# Patient Record
Sex: Female | Born: 1960
Health system: Southern US, Community
[De-identification: ages and names within clinical notes are randomized; demographics above are authoritative.]

## PROBLEM LIST (undated history)

## (undated) DIAGNOSIS — Z5189 Encounter for other specified aftercare: Secondary | ICD-10-CM

## (undated) DIAGNOSIS — D649 Anemia, unspecified: Secondary | ICD-10-CM

## (undated) DIAGNOSIS — F419 Anxiety disorder, unspecified: Secondary | ICD-10-CM

## (undated) DIAGNOSIS — R7303 Prediabetes: Secondary | ICD-10-CM

## (undated) DIAGNOSIS — I517 Cardiomegaly: Secondary | ICD-10-CM

## (undated) DIAGNOSIS — R0602 Shortness of breath: Secondary | ICD-10-CM

## (undated) DIAGNOSIS — IMO0001 Reserved for inherently not codable concepts without codable children: Secondary | ICD-10-CM

## (undated) DIAGNOSIS — R51 Headache: Secondary | ICD-10-CM

## (undated) DIAGNOSIS — C8590 Non-Hodgkin lymphoma, unspecified, unspecified site: Secondary | ICD-10-CM

## (undated) DIAGNOSIS — I10 Essential (primary) hypertension: Secondary | ICD-10-CM

## (undated) DIAGNOSIS — K219 Gastro-esophageal reflux disease without esophagitis: Secondary | ICD-10-CM

## (undated) DIAGNOSIS — T7840XA Allergy, unspecified, initial encounter: Secondary | ICD-10-CM

## (undated) HISTORY — PX: WISDOM TOOTH EXTRACTION: SHX21

## (undated) HISTORY — PX: COLONOSCOPY: SHX174

## (undated) HISTORY — DX: Non-Hodgkin lymphoma, unspecified, unspecified site: C85.90

## (undated) HISTORY — DX: Allergy, unspecified, initial encounter: T78.40XA

## (undated) HISTORY — DX: Prediabetes: R73.03

## (undated) HISTORY — DX: Anxiety disorder, unspecified: F41.9

## (undated) HISTORY — PX: MYOMECTOMY: SHX85

## (undated) HISTORY — PX: APPENDECTOMY: SHX54

---

## 1999-03-02 ENCOUNTER — Inpatient Hospital Stay (HOSPITAL_COMMUNITY): Admission: AD | Admit: 1999-03-02 | Discharge: 1999-03-07 | Payer: Self-pay | Admitting: Obstetrics and Gynecology

## 2000-09-09 ENCOUNTER — Encounter: Admission: RE | Admit: 2000-09-09 | Discharge: 2000-09-09 | Payer: Self-pay | Admitting: Obstetrics and Gynecology

## 2000-09-09 ENCOUNTER — Encounter: Payer: Self-pay | Admitting: Obstetrics and Gynecology

## 2001-10-16 ENCOUNTER — Encounter: Admission: RE | Admit: 2001-10-16 | Discharge: 2001-10-16 | Payer: Self-pay | Admitting: Obstetrics and Gynecology

## 2001-10-16 ENCOUNTER — Encounter: Payer: Self-pay | Admitting: Obstetrics and Gynecology

## 2001-12-28 ENCOUNTER — Encounter: Admission: RE | Admit: 2001-12-28 | Discharge: 2002-01-14 | Payer: Self-pay | Admitting: Orthopaedic Surgery

## 2003-11-30 ENCOUNTER — Other Ambulatory Visit: Admission: RE | Admit: 2003-11-30 | Discharge: 2003-11-30 | Payer: Self-pay | Admitting: Obstetrics & Gynecology

## 2005-06-14 ENCOUNTER — Other Ambulatory Visit: Admission: RE | Admit: 2005-06-14 | Discharge: 2005-06-14 | Payer: Self-pay | Admitting: Obstetrics & Gynecology

## 2007-07-06 ENCOUNTER — Emergency Department (HOSPITAL_COMMUNITY): Admission: EM | Admit: 2007-07-06 | Discharge: 2007-07-06 | Payer: Self-pay | Admitting: Emergency Medicine

## 2007-09-17 ENCOUNTER — Emergency Department (HOSPITAL_COMMUNITY): Admission: EM | Admit: 2007-09-17 | Discharge: 2007-09-17 | Payer: Self-pay | Admitting: Emergency Medicine

## 2008-10-27 IMAGING — CT CT HEAD W/O CM
1 series · 16 of 28 positions shown, 20 images · IV contrast (agent unspecified)
Comparison: None.

CLINICAL DATA: Hypertension/headache.
 HEAD CT WITHOUT CONTRAST:
TECHNIQUE: Contiguous axial images were obtained from the base of the skull through the vertex according to standard protocol without contrast.

[Series 2: brain · axial · 0.47mm/px · z∈[+158,+288]mm · 16 of 28 slices shown, 20 images]
[im 2/28  brain]
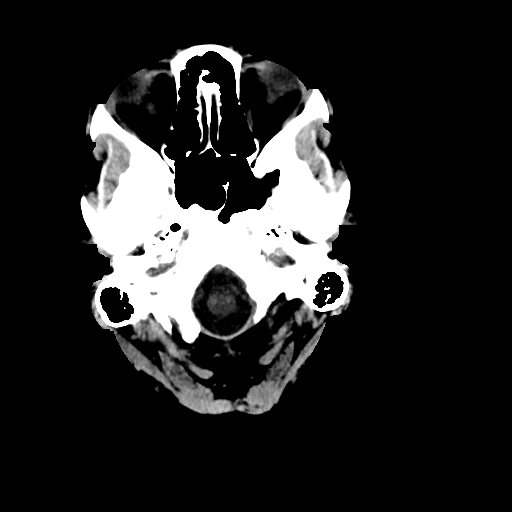
[im 2/28  bone]
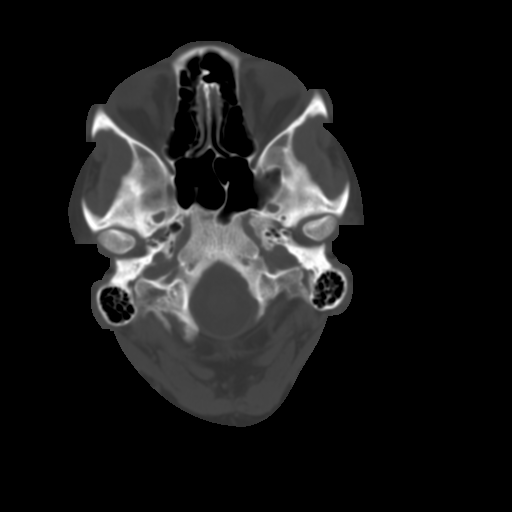
[im 4/28  brain]
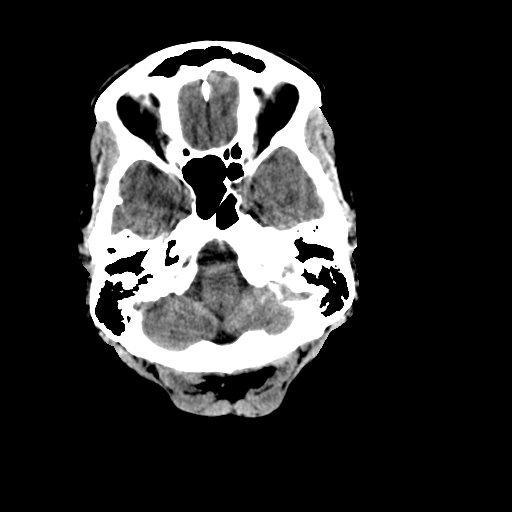
[im 6/28  brain]
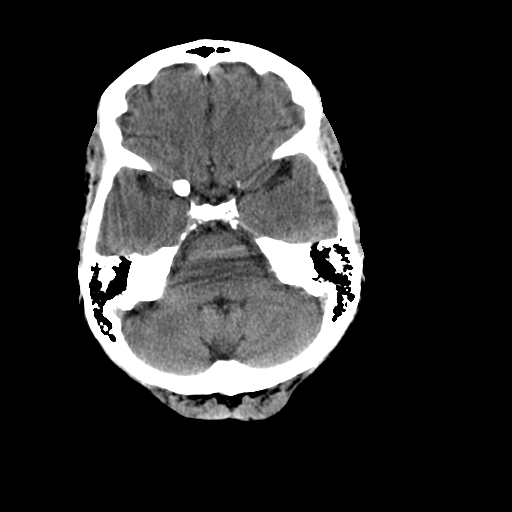
[im 7/28  brain]
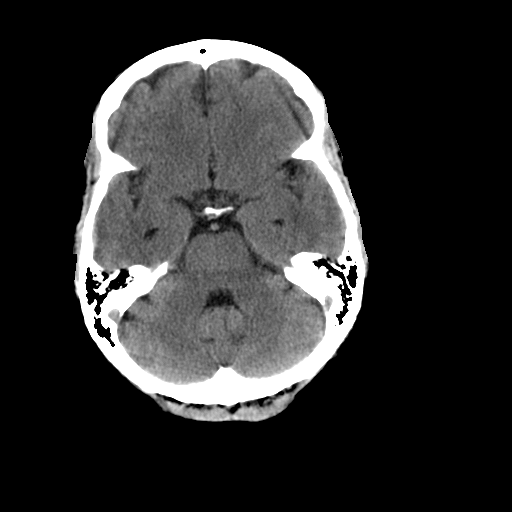
[im 9/28  brain]
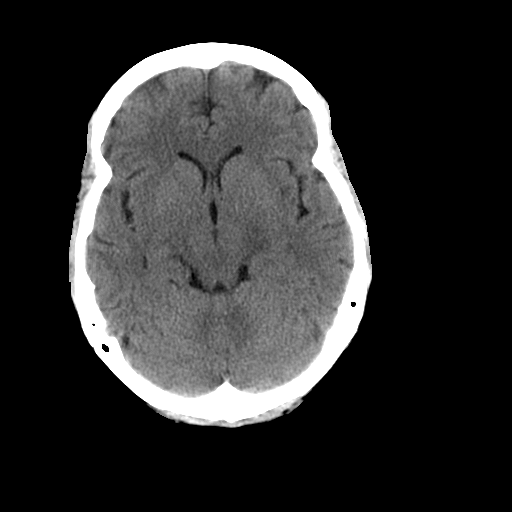
[im 9/28  bone]
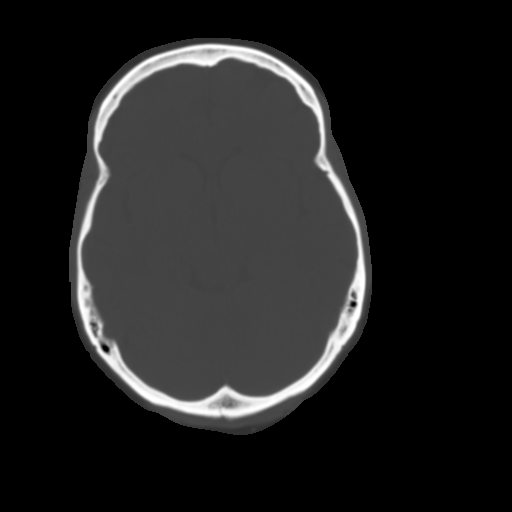
[im 10/28  brain]
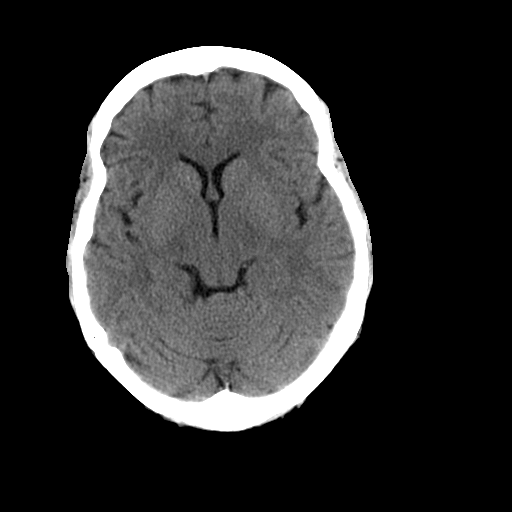
[im 12/28  brain]
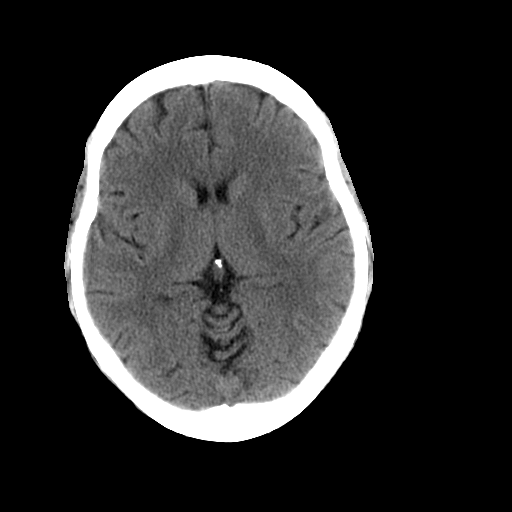
[im 14/28  brain]
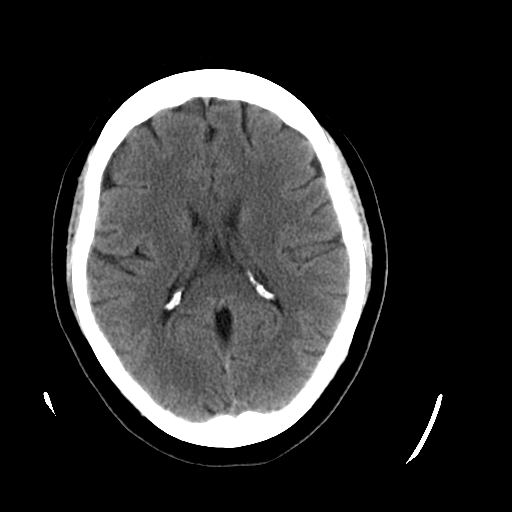
[im 15/28  brain]
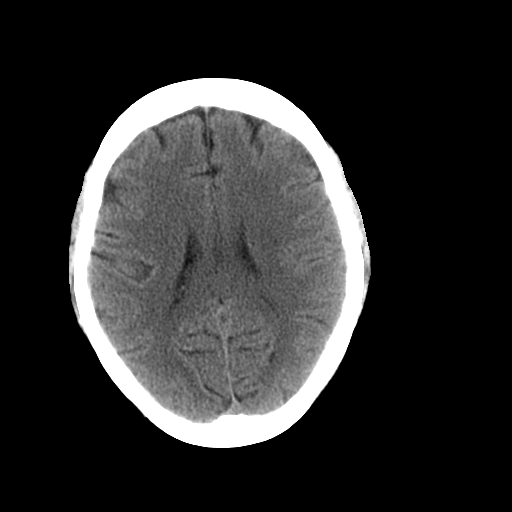
[im 15/28  bone]
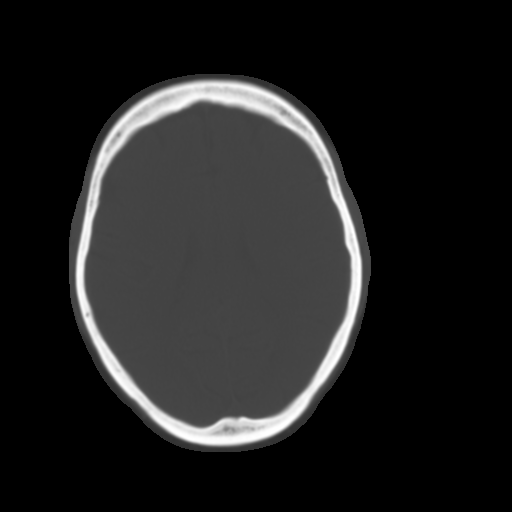
[im 17/28  brain]
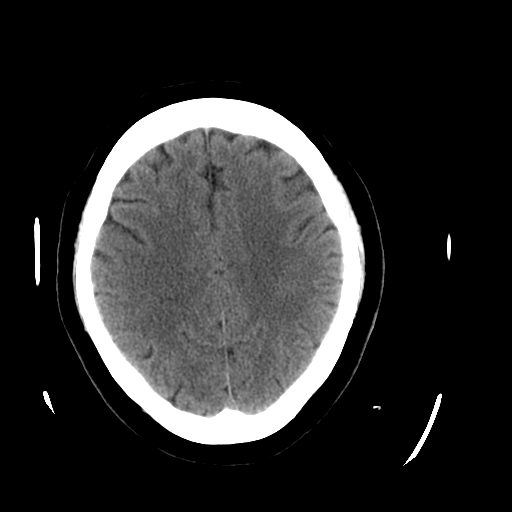
[im 19/28  brain]
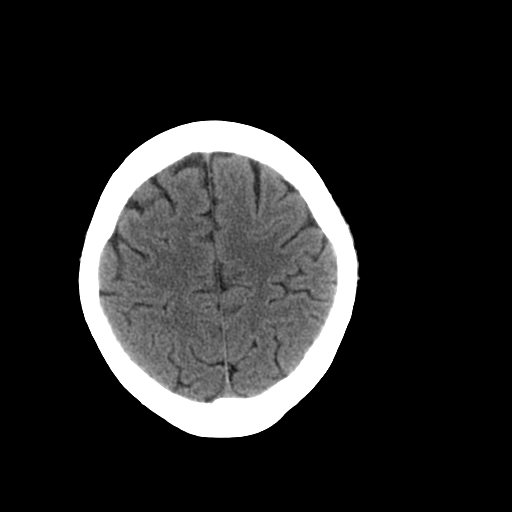
[im 20/28  brain]
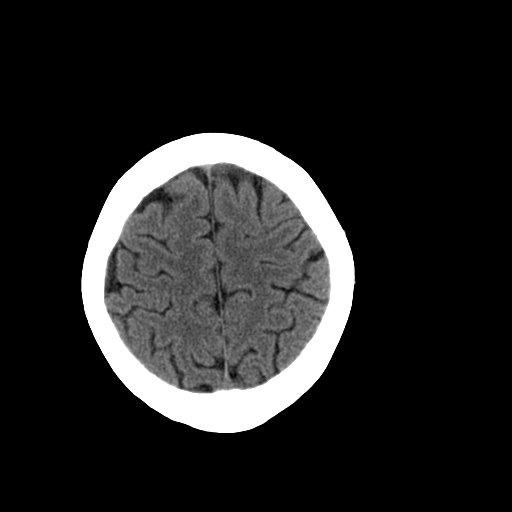
[im 22/28  brain]
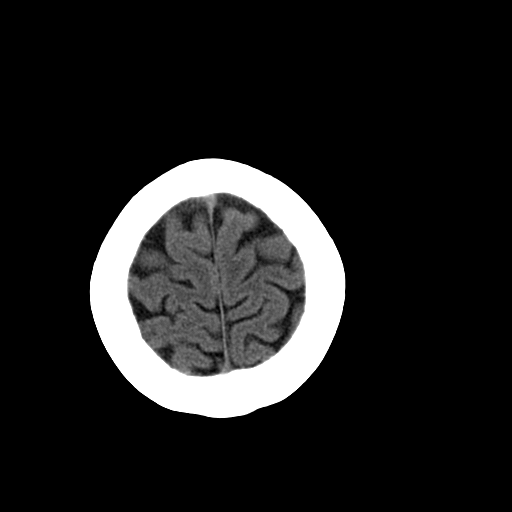
[im 22/28  bone]
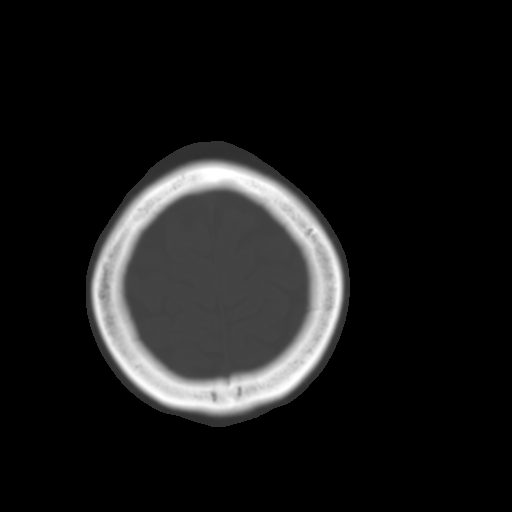
[im 23/28  brain]
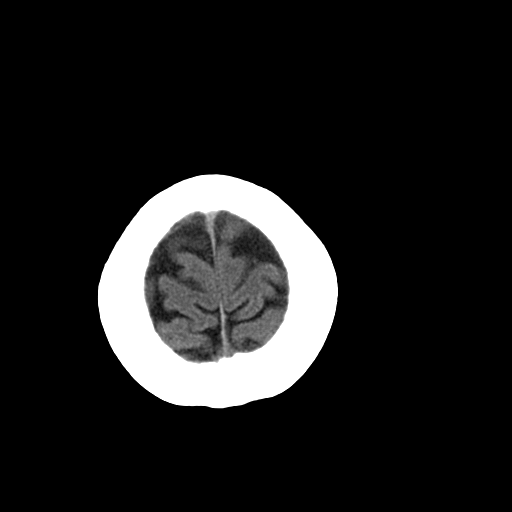
[im 25/28  brain]
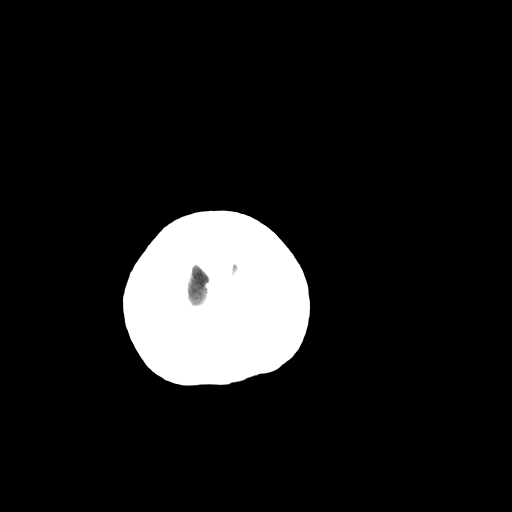
[im 27/28  brain]
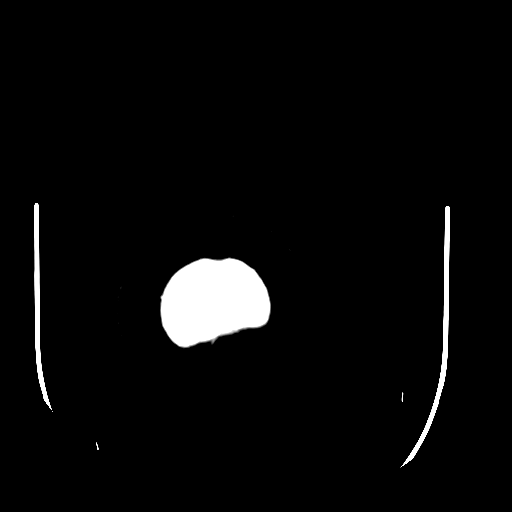

[16 of 28 positions shown; findings below may reference images not displayed]

FINDINGS: There is no evidence of intracranial hemorrhage, brain edema, acute infarct, mass lesion, or mass effect.  No other intra-axial abnormalities are seen, and the ventricles are within normal limits.  No abnormal extra-axial fluid collections or masses are identified.  No skull abnormalities are noted.
IMPRESSION: Negative non-contrast head CT.

## 2008-11-10 ENCOUNTER — Other Ambulatory Visit: Admission: RE | Admit: 2008-11-10 | Discharge: 2008-11-10 | Payer: Self-pay | Admitting: Family Medicine

## 2010-11-19 ENCOUNTER — Ambulatory Visit: Payer: Self-pay | Admitting: Family Medicine

## 2010-11-29 ENCOUNTER — Ambulatory Visit: Payer: Self-pay | Admitting: Family Medicine

## 2011-04-08 ENCOUNTER — Encounter (HOSPITAL_COMMUNITY): Payer: Self-pay

## 2011-04-08 ENCOUNTER — Encounter (HOSPITAL_COMMUNITY)
Admission: RE | Admit: 2011-04-08 | Discharge: 2011-04-08 | Disposition: A | Discharge status: Home / Self Care | Payer: 59 | Source: Ambulatory Visit | Attending: Obstetrics and Gynecology | Admitting: Obstetrics and Gynecology

## 2011-04-08 HISTORY — DX: Essential (primary) hypertension: I10

## 2011-04-08 HISTORY — DX: Reserved for inherently not codable concepts without codable children: IMO0001

## 2011-04-08 HISTORY — DX: Cardiomegaly: I51.7

## 2011-04-08 HISTORY — DX: Headache: R51

## 2011-04-08 HISTORY — DX: Shortness of breath: R06.02

## 2011-04-08 HISTORY — DX: Gastro-esophageal reflux disease without esophagitis: K21.9

## 2011-04-08 HISTORY — DX: Anemia, unspecified: D64.9

## 2011-04-08 HISTORY — DX: Encounter for other specified aftercare: Z51.89

## 2011-04-08 LAB — BASIC METABOLIC PANEL
CO2: 28 mEq/L (ref 19–32)
Calcium: 10.6 mg/dL — ABNORMAL HIGH (ref 8.4–10.5)
Glucose, Bld: 91 mg/dL (ref 70–99)
Potassium: 4.1 mEq/L (ref 3.5–5.1)
Sodium: 138 mEq/L (ref 135–145)

## 2011-04-08 LAB — CBC
Hemoglobin: 11.5 g/dL — ABNORMAL LOW (ref 12.0–15.0)
MCH: 23.4 pg — ABNORMAL LOW (ref 26.0–34.0)
RBC: 4.92 MIL/uL (ref 3.87–5.11)

## 2011-04-08 LAB — SURGICAL PCR SCREEN: Staphylococcus aureus: NEGATIVE

## 2011-04-08 NOTE — Anesthesia Preprocedure Evaluation (Deleted)
Anesthesia Evaluation     <MCANESPLAN>  @ANQUICKEVALUATION@  

## 2011-04-08 NOTE — Patient Instructions (Addendum)
20 [name]  04/08/2011   Your procedure is scheduled on:  April 25, 2011  Report to [facility] at 6 AM.  Call this number if you have problems the morning of surgery: [phone]   Remember:   Do not eat food:After Midnight.Wednesday  Do not drink clear liquids: After Midnight Wednesday.  Take these medicines the morning of surgery with A SIP OF WATER: BP med   Do not wear jewelry, make-up or nail polish.  Do not bring valuables to the hospital.  Contacts, dentures or bridgework may not be worn into surgery.  Leave suitcase in the car. After surgery it may be brought to your room.  For patients admitted to the hospital, checkout time is 11:00 AM the day of discharge.   Patients discharged the day of surgery will not be allowed to drive home.  Name and phone number of your driver:   Special Instructions: CHG Shower Use entire pack 2 days before surgery and repeat 1 day before surgery.   Please read over the following fact sheets that you were given: Pain Booklet and MRSA Information

## 2011-04-08 NOTE — Pre-Procedure Instructions (Addendum)
Patient was seen by Dr Malen Gauze at end of pre-op appt.  Dr Malen Gauze informed patient to take reflux med 3 days prior to surgery daily and take bp med Day of surgery with sip of water.  Patient to stop calcium citrate DOS per Dr Malen Gauze.  Patient verbalized understanding.  Dr Malen Gauze asked patient to get MG med refilled and bring it with her day of surgery.

## 2011-04-08 NOTE — Anesthesia Preprocedure Evaluation (Deleted)
Anesthesia Evaluation     <MCANESPLAN>  @ANQUICKEVALUATION @

## 2011-04-14 HISTORY — PX: OTHER SURGICAL HISTORY: SHX169

## 2011-04-22 ENCOUNTER — Other Ambulatory Visit (HOSPITAL_COMMUNITY): Payer: 59

## 2011-04-24 ENCOUNTER — Encounter (HOSPITAL_COMMUNITY): Payer: Self-pay | Admitting: Anesthesiology

## 2011-04-24 MED ORDER — DEXTROSE 5 % IV SOLN
2.0000 g | INTRAVENOUS | Status: AC
  Administered 2011-04-25: 2 g via INTRAVENOUS
  Filled 2011-04-24: qty 2

## 2011-04-24 MED ORDER — DEXTROSE 5 % IV SOLN
2.0000 g | Freq: Once | INTRAVENOUS | Status: DC
  Filled 2011-04-24: qty 2

## 2011-04-24 NOTE — H&P (Signed)
[name], [name]                    ACCOUNT NO.:  [id number]  MEDICAL RECORD NO.:  [id number]  LOCATION:  [facility]                           FACILITY:  [facility]  PHYSICIAN:  Dois Davenport A. Rivard, M.D. DATE OF BIRTH:  1960/11/05  DATE OF ADMISSION:  04/08/2011 DATE OF DISCHARGE:  04/08/2011                             HISTORY & PHYSICAL   HISTORY OF PRESENT ILLNESS:  [name] is a 50 year old single black female para 1-0-2-1 presenting for robotically-assisted supracervical hysterectomy because of symptomatic uterine fibroids.  This patient is status post myomectomy (1995) who for the past several years has had vaginal bleeding that may last up to 2 weeks and require her to change a pad twice an hour or every 2 hours.  Additionally, the patient has  large clots and menstrual cramps that she rates as a 10/10 on a 10-point pain scale.  The patient is able to achieve some relief from her cramping by taking ibuprofen 800 mg decreasing her pain to 5/10 on a 10- point pain scale.  In February 2012, her hemoglobin/hematocrit was 9.8/33.3 respectively and she had an endometrial biopsy that did not show any hyperplasia, atypia, or malignancy.  The patient was given in May 2012 Lupron Depot 11.25 mg IM and since that time she has been amenorrheic.  She goes on to say, however, that she does experience some mild pelvic discomfort around the time she normally would have a menstrual period.  The patient denies any difficulty urinating, any changes in her bowel habits, vaginitis symptoms, dyspareunia, or back pain.  A pelvic ultrasound done prior to receiving Lupron Depot showed a uterus measuring 18.0 x 13.1 x 8.93 cm and a posterior fibroid measuring 14.6 x 12.3 x 9.86 cm.  Both of the patient's ovaries appeared normal on that study.  In May 2012, after having been on Lupron for 2 months, her uterus decreased to 15.7 cm x 11.5 cm x 9.30 cm and her posterior fibroid decreased to 10.4 x 10.8 x 9.47 cm.  Again, the  patient's ovaries appeared normal on this study.  Given the protracted nature of the patient's vaginal bleeding and the size of her uterine fibroid, she has consented to proceed with definitive therapy in the form of a robotically- assisted supra-cervical hysterectomy.  PAST MEDICAL HISTORY:  OB History:  Gravida 3, para 1-0-2-1.  The patient had cesarean section in 2000.  GYN History:  Menarche 50 years old.  Last menstrual period March 2012.  She uses abstinence as her method of contraception.  She denies any history of sexually transmitted diseases or abnormal Pap smears.  Her last normal Pap smear was February 2012.    Past Medical History:  Hypertension, anemia, blood transfusion due to menorrhagia, myasthenia gravis, hypercalcemia, herpes simplex virus #2, H. pylori, and vitamin D deficiency.  SURGICAL HISTORY:  1975 appendectomy, 1995 myomectomy.  Denies any problems with anesthesia.  FAMILY HISTORY:  Thyroid disease, lung cancer, cardiovascular disease, diabetes mellitus, hypertension, and migraines.  SOCIAL HISTORY:  The patient is single and she works in [employer].    Habits:  She does not use tobacco or illicit drugs.  She rarely  consumes alcohol.  CURRENT MEDICATIONS: 1. Lisinopril/HCTZ 20/25 mg daily 2. Vitamin D 50,000 units twice weekly. 3. Omeprazole 40 mg daily as needed.  ALLERGIES:  The patient does have a sensitivity to CODEINE that causes GI upset.  She denies any sensitivities to peanuts, soy, or shellfish, but is uncertain as to whether she has any latex sensitivity   [the patient is to began on April 23, 2011, Mestinon 180 mg extended release Daily for her Myasthenia Gravis].  REVIEW OF SYSTEMS:  The patient does wear glasses.  She admits to arthralgias and eyelid weakness.  She denies any chest pain, shortness of breath, headache, vision changes, myalgias, dysphagia, nasal congestion, nausea, vomiting, diarrhea, urinary tract  symptoms, unilateral weakness, paresthesias, or extremity muscle weakness.  PHYSICAL EXAM:  VITAL SIGNS:  Blood pressure 140/80, pulse is 78, respirations 16, temperature 98.0 degrees Fahrenheit orally, weight 203 pounds, height 5 feet 6 inches tall, body mass index 32.8.  NECK:  Supple without masses.  There is no thyromegaly or cervical adenopathy. HEART:  Regular rate and rhythm. LUNGS:  Clear. BACK:  No CVA tenderness. ABDOMEN:  Tender in the right lower quadrant, but no rebound, though there is some voluntary guarding.  There is a firm mass arising from the pelvis to approximately three fingerbreadths below the umbilicus.  There is no organomegaly. EXTREMITIES:  No clubbing, cyanosis or edema. PELVIC:  EGBUS is normal.  Vagina is normal.  Cervix is nontender without lesions.  Uterus appears 20-22 weeks' size with diffuse tenderness.  Adnexa without any separable masses.  IMPRESSION:  Symptomatic uterine fibroids.  DISPOSITION:  A discussion was held with the patient regarding indications for her procedures along with their risks which include, but are not limited to reaction to anesthesia, damage to adjacent organs, infection, excessive bleeding, and a possible need for an open abdominal incision.  The patient further understands that hospital stay is expected to be 0-2 days and that she is expected to return to her activities within 2-3 weeks.  The patient also was informed that the robotic approach requires more time than that of an open abdominal incision and she will receive and complete a MiraLax bowel prep 24 hours prior to her procedure.  The patient has consented to proceed with the robotically-assisted supracervical hysterectomy at [facility] on April 25, 2011, at 7:30 a.m.     Rahiem Schellinger J. Lowell Guitar, P.A.-C   ______________________________ Crist Fat Rivard, M.D.    EJP/MEDQ  D:  04/23/2011  T:  04/24/2011  Job:  [id number]

## 2011-04-24 NOTE — H&P (Unsigned)
NAMEMADINAH, QUARRY                    ACCOUNT NO.:  [id number]  MEDICAL RECORD NO.:  [id number]  LOCATION:  [facility]                           FACILITY:  [facility]  PHYSICIAN:  Dois Davenport A. Rivard, M.D. DATE OF BIRTH:  10-Dec-1960  DATE OF ADMISSION:  04/08/2011 DATE OF DISCHARGE:  04/08/2011                             HISTORY & PHYSICAL   HISTORY OF PRESENT ILLNESS:  Ms. Barb is a 50 year old single black female, para 1-0-2-1, presenting for robotic-assisted supracervical hysterectomy because of symptomatic uterine fibroids.  The patient is status post myomectomy (1995) who complains of heavy vaginal bleeding for the past several years.  The patient will flow for up to 2 weeks requiring her to change the pad on occasion twice an hour to every 2 hours.  Additionally, she will have large clotting and experience cramping, which she rates the 10/10 on a 10-point pain scale.  The patient does find some relief with ibuprofen 800 mg decreasing her pain to 5/10 on a 10-point pain scale.  She goes on to say she has had some intermenstrual bleeding, but denies any problems with urination changes in her bowel habits, dyspareunia, vaginitis symptoms, or back pain.  The patient underwent an endometrial biopsy in February 2012, which did not reveal any hyperplasia, atypia, or malignancy.  A pelvic ultrasound at that same time showed a uterus measuring 18.0 cm x 13.1 cm x 8.93 cm with a posterior fibroid measuring 14.6 x 12.3 x 9.86 cm with both ovaries appeared normal on that study.  The patient was placed on Lupron Depot 11.25 mg in March 2012.  At that time, her hemoglobin and hematocrit were 9.8/33.3 respectively.  A followup ultrasound in May 2012 shows some decrease in her uterine size with her uterus measuring 15.7 cm x 11.5 cm x 9.3 cm and the posterior fibroid decreasing to 10.4 x 10.8 x 9.47 cm.  Again, the patient's ovaries appeared normal on that study.  During the course of the patient Lupron therapy,  she remained amenorrheic and only experience mild discomfort around the time of her menstrual only mild pelvic discomfort around the time she normally would have a menstrual period.  Given the patient's protracted and significantly disruptive symptomatology along with the size of her uterine fibroids, the patient has decided to proceed with definitive therapy in the form of a supracervical hysterectomy for management of her symptoms.  PAST MEDICAL HISTORY:  OB History:  Gravida 3, para 1-0-2-1.  The patient had a cesarean section and 2000.  GYN History:  Menarche 45 years' old.  Last menstrual period March 2012.  She uses abstinence of contraception.  Denies any history of abnormal Pap smears or sexually transmitted diseases.  Her last normal Pap smear was February 2012.  MEDICAL HISTORY:  Hypertension, anemia, blood transfusions due to menorrhagia, myasthenia gravis, hypercalcemia, herpes simplex virus #2, and H. pylori, vitamin D deficiency.  SURGICAL HISTORY:  1975 appendectomy, 1995 myomectomy.  Denies any problems with anesthesia.  FAMILY HISTORY:  Thyroid disease, lung disease, cardiovascular disease, diabetes mellitus, hypertension, migraines.  SOCIAL HISTORY:  The patient is single and she works at [employer].  Habits:  She  does not use tobacco or illicit drugs.  She does admit to rare alcohol intake.  CURRENT MEDICATIONS: 1. Lisinopril/HCTZ 20/25 mg. 2. Vitamin D 50,000 units twice a week. 3. Omeprazole 40 mg as needed.  ALLERGIES:  The patient has a sensitivity to CODEINE that causes nausea, vomiting.  She denies any sensitivities to soy, peanuts, or shellfish. She is uncertain about her reaction to latex (the patient is to be begin Mestinon 180 mg extended release on July 10 one tablet daily.  REVIEW OF SYSTEMS:  The patient does wear glasses.  She admits to the eyelid weakness, arthralgias, but denies any chest pain, shortness of breath, headache, vision  changes, myalgias, dysphagia, nasal congestion, nausea, vomiting, diarrhea, dysuria, hematuria, urinary frequency, night sweats, fever, and except as is mentioned in history of present illness, the patient's review of systems is otherwise negative.  PHYSICAL EXAMINATION:  VITAL SIGNS:  Blood pressure 140/80, pulse is 78, respirations are 16, temperature 98 degrees Fahrenheit orally, weight is 203 pounds, height is 5 feet 6 inches' tall.  Body mass index is 32.8. NECK:  Supple without masses.  There is no thyromegaly or cervical adenopathy. HEART:  Regular rate and rhythm. LUNGS:  Clear. BACK:  No CVA tenderness. ABDOMEN:  Bowel sounds present.  There is right lower quadrant tenderness without guarding.  No rebound or organomegaly.  The patient does have a firm mass arising from the pelvis to approximately 3 fingerbreadths below the umbilicus. EXTREMITIES:  No clubbing, cyanosis or edema. PELVIC:  EGBUS is normal.  Vagina is normal.  Cervix is nontender without lesions.  Uterus appears 20-22 weeks' size.  Adnexa without any separable masses.  IMPRESSION:  Symptomatic uterine fibroids.  Dictation ended at this point.     Sun Wilensky J. Adline Peals.   ______________________________ Crist Fat Rivard, M.D.    EJP/MEDQ  D:  04/23/2011  T:  04/24/2011  Job:  [id number]

## 2011-04-24 NOTE — Anesthesia Preprocedure Evaluation (Addendum)
Anesthesia Evaluation  Name, MR# and DOB Patient awake  General Assessment Comment  Reviewed: Allergy & Precautions, H&P  and Patient's Chart, lab work & pertinent test results  Airway Mallampati: III TM Distance: >3 FB Neck ROM: Full    Dental No notable dental hx (+) Teeth Intact   Pulmonaryneg pulmonary ROS    clear to auscultation  pulmonary exam normal   Cardiovascular Exercise Tolerance: Good Regular Normal   Neuro/Psych Neuromuscular disease (affects eyes mainly. last episode 3-4 years ago. took Mestinon @that  time. has not seen Neurologist in last 4 years. no bulbar symptoms.)Negative Neurological ROS Negative Psych ROS  GI/Hepatic/Renal negative GI ROS, negative Liver ROS, and negative Renal ROS (+)       Endo/Other  Negative Endocrine ROS (+)   Abdominal Normal abdominal exam  (+)   Musculoskeletal negative musculoskeletal ROS (+)  Hematology negative hematology ROS (+)   Peds  Reproductive/Obstetrics negative OB ROS   Anesthesia Other Findings             Anesthesia Physical Anesthesia Plan  ASA: II  Anesthesia Plan: General   Post-op Pain Management:    Induction: Intravenous  Airway Management Planned: Oral ETT  Additional Equipment:   Intra-op Plan:   Post-operative Plan: Extubation in OR  Informed Consent: I have reviewed the patients History and Physical, chart, labs and discussed the procedure including the risks, benefits and alternatives for the proposed anesthesia with the patient or authorized representative who has indicated his/her understanding and acceptance.   Dental advisory given  Plan Discussed with: CRNA  Anesthesia Plan Comments: (This will be a prolonged induction due to her hx/o Myasthenia. Will carefully monitor response to muscle relaxants. Will probably need significantly less muscle relaxants for procedure.)       Anesthesia Quick Evaluation

## 2011-04-25 ENCOUNTER — Encounter (HOSPITAL_COMMUNITY)
Admission: RE | Disposition: A | Discharge status: Home / Self Care | Payer: Self-pay | Source: Ambulatory Visit | Attending: Obstetrics and Gynecology

## 2011-04-25 ENCOUNTER — Encounter (HOSPITAL_COMMUNITY): Payer: Self-pay | Admitting: Anesthesiology

## 2011-04-25 ENCOUNTER — Ambulatory Visit (HOSPITAL_COMMUNITY)
Admission: RE | Admit: 2011-04-25 | Discharge: 2011-04-26 | Disposition: A | Discharge status: Home / Self Care | Payer: 59 | Source: Ambulatory Visit | Attending: Obstetrics and Gynecology | Admitting: Obstetrics and Gynecology

## 2011-04-25 ENCOUNTER — Other Ambulatory Visit: Payer: Self-pay | Admitting: Obstetrics and Gynecology

## 2011-04-25 ENCOUNTER — Ambulatory Visit (HOSPITAL_COMMUNITY): Payer: 59 | Admitting: Anesthesiology

## 2011-04-25 DIAGNOSIS — D649 Anemia, unspecified: Secondary | ICD-10-CM | POA: Insufficient documentation

## 2011-04-25 DIAGNOSIS — Z01818 Encounter for other preprocedural examination: Secondary | ICD-10-CM | POA: Insufficient documentation

## 2011-04-25 DIAGNOSIS — I10 Essential (primary) hypertension: Secondary | ICD-10-CM | POA: Insufficient documentation

## 2011-04-25 DIAGNOSIS — D259 Leiomyoma of uterus, unspecified: Secondary | ICD-10-CM

## 2011-04-25 DIAGNOSIS — Z01812 Encounter for preprocedural laboratory examination: Secondary | ICD-10-CM | POA: Insufficient documentation

## 2011-04-25 LAB — CALCIUM: Calcium: 9.6 mg/dL (ref 8.4–10.5)

## 2011-04-25 LAB — PREGNANCY, URINE: Preg Test, Ur: NEGATIVE

## 2011-04-25 SURGERY — ROBOTIC ASSISTED SUPRACERVICAL HYSTERECTOMY
Anesthesia: General

## 2011-04-25 MED ORDER — HYDROMORPHONE HCL 1 MG/ML IJ SOLN
INTRAMUSCULAR | Status: DC | PRN
  Administered 2011-04-25 (×2): 1 mg via INTRAVENOUS

## 2011-04-25 MED ORDER — ROCURONIUM BROMIDE 50 MG/5ML IV SOLN
INTRAVENOUS | Status: AC
  Filled 2011-04-25: qty 1

## 2011-04-25 MED ORDER — MIDAZOLAM HCL 5 MG/5ML IJ SOLN
INTRAMUSCULAR | Status: DC | PRN
  Administered 2011-04-25: 2 mg via INTRAVENOUS

## 2011-04-25 MED ORDER — HYDROMORPHONE HCL 1 MG/ML IJ SOLN
1.0000 mg | Freq: Once | INTRAMUSCULAR | Status: AC
  Administered 2011-04-25: 1 mg via INTRAVENOUS
  Filled 2011-04-25: qty 1

## 2011-04-25 MED ORDER — HYDROMORPHONE HCL 1 MG/ML IJ SOLN
INTRAMUSCULAR | Status: AC
  Filled 2011-04-25: qty 1

## 2011-04-25 MED ORDER — FENTANYL CITRATE 0.05 MG/ML IJ SOLN
INTRAMUSCULAR | Status: AC
  Filled 2011-04-25: qty 5

## 2011-04-25 MED ORDER — OXYCODONE-ACETAMINOPHEN 5-325 MG PO TABS: 1.0000 | ORAL_TABLET | ORAL | Status: AC | PRN

## 2011-04-25 MED ORDER — LISINOPRIL 20 MG PO TABS
20.0000 mg | ORAL_TABLET | Freq: Every day | ORAL | Status: AC
  Administered 2011-04-25: 20 mg via ORAL
  Filled 2011-04-25: qty 1

## 2011-04-25 MED ORDER — MIDAZOLAM HCL 2 MG/2ML IJ SOLN
INTRAMUSCULAR | Status: AC
  Filled 2011-04-25: qty 2

## 2011-04-25 MED ORDER — ONDANSETRON HCL 4 MG/2ML IJ SOLN
INTRAMUSCULAR | Status: DC | PRN
  Administered 2011-04-25: 4 mg via INTRAVENOUS

## 2011-04-25 MED ORDER — ONDANSETRON HCL 4 MG PO TABS: 4.0000 mg | ORAL_TABLET | Freq: Four times a day (QID) | ORAL | Status: DC | PRN

## 2011-04-25 MED ORDER — MENTHOL 3 MG MT LOZG: 1.0000 | LOZENGE | OROMUCOSAL | Status: DC | PRN

## 2011-04-25 MED ORDER — EPHEDRINE 5 MG/ML INJ
INTRAVENOUS | Status: AC
  Filled 2011-04-25: qty 10

## 2011-04-25 MED ORDER — ROCURONIUM BROMIDE 50 MG/5ML IV SOLN
INTRAVENOUS | Status: AC
  Filled 2011-04-25: qty 2

## 2011-04-25 MED ORDER — NEOSTIGMINE METHYLSULFATE 1 MG/ML IJ SOLN
INTRAMUSCULAR | Status: DC | PRN
  Administered 2011-04-25: 5 mg via INTRAMUSCULAR

## 2011-04-25 MED ORDER — LIDOCAINE HCL (CARDIAC) 10 MG/ML IV SOLN
INTRAVENOUS | Status: DC | PRN
  Administered 2011-04-25: 80 mg via INTRAVENOUS

## 2011-04-25 MED ORDER — BISACODYL 10 MG RE SUPP: 10.0000 mg | Freq: Every day | RECTAL | Status: DC | PRN

## 2011-04-25 MED ORDER — LABETALOL HCL 5 MG/ML IV SOLN
INTRAVENOUS | Status: AC
  Administered 2011-04-25: 40 mg via INTRAVENOUS
  Filled 2011-04-25: qty 8

## 2011-04-25 MED ORDER — LACTATED RINGERS IV SOLN
INTRAVENOUS | Status: DC | PRN
  Administered 2011-04-25 (×2): via INTRAVENOUS

## 2011-04-25 MED ORDER — EPHEDRINE SULFATE 50 MG/ML IJ SOLN
INTRAMUSCULAR | Status: DC | PRN
  Administered 2011-04-25: 5 mg via INTRAVENOUS

## 2011-04-25 MED ORDER — SUCCINYLCHOLINE CHLORIDE 20 MG/ML IJ SOLN
INTRAMUSCULAR | Status: AC
  Filled 2011-04-25: qty 1

## 2011-04-25 MED ORDER — HYDROMORPHONE HCL 1 MG/ML IJ SOLN
1.0000 mg | INTRAMUSCULAR | Status: DC | PRN
  Administered 2011-04-26: 1 mg via INTRAVENOUS
  Filled 2011-04-25: qty 1

## 2011-04-25 MED ORDER — LABETALOL HCL 5 MG/ML IV SOLN
40.0000 mg | Freq: Once | INTRAVENOUS | Status: AC
  Administered 2011-04-25: 40 mg via INTRAVENOUS

## 2011-04-25 MED ORDER — ONDANSETRON HCL 4 MG/2ML IJ SOLN
INTRAMUSCULAR | Status: AC
  Filled 2011-04-25: qty 2

## 2011-04-25 MED ORDER — NEOSTIGMINE METHYLSULFATE 1 MG/ML IJ SOLN
INTRAMUSCULAR | Status: AC
  Filled 2011-04-25: qty 10

## 2011-04-25 MED ORDER — GLYCOPYRROLATE 0.2 MG/ML IJ SOLN
INTRAMUSCULAR | Status: DC | PRN
  Administered 2011-04-25: 1 mg via INTRAVENOUS
  Administered 2011-04-25: 0.2 mg via INTRAVENOUS

## 2011-04-25 MED ORDER — LACTATED RINGERS IR SOLN
Status: DC | PRN
  Administered 2011-04-25: 3000 mL

## 2011-04-25 MED ORDER — SODIUM CHLORIDE 0.9 % IV SOLN
250.0000 mL | INTRAVENOUS | Status: DC
  Administered 2011-04-25: 250 mL via INTRAVENOUS

## 2011-04-25 MED ORDER — SIMETHICONE 80 MG PO CHEW: 80.0000 mg | CHEWABLE_TABLET | Freq: Four times a day (QID) | ORAL | Status: DC | PRN

## 2011-04-25 MED ORDER — SODIUM CHLORIDE 0.9 % IV SOLN: 250.0000 mL | INTRAVENOUS | Status: DC

## 2011-04-25 MED ORDER — LACTATED RINGERS IV SOLN
INTRAVENOUS | Status: DC
  Administered 2011-04-25: 1000 mL via INTRAVENOUS
  Administered 2011-04-25: 07:00:00 via INTRAVENOUS

## 2011-04-25 MED ORDER — SODIUM CHLORIDE 0.9 % IJ SOLN: 3.0000 mL | INTRAMUSCULAR | Status: DC | PRN

## 2011-04-25 MED ORDER — SODIUM CHLORIDE 0.9 % IV SOLN: 1.0000 mg/h | Freq: Once | INTRAVENOUS | Status: DC

## 2011-04-25 MED ORDER — SODIUM CHLORIDE 0.9 % IJ SOLN: 3.0000 mL | Freq: Two times a day (BID) | INTRAMUSCULAR | Status: DC

## 2011-04-25 MED ORDER — LACTATED RINGERS IV SOLN
INTRAVENOUS | Status: DC
  Administered 2011-04-25 – 2011-04-26 (×2): via INTRAVENOUS

## 2011-04-25 MED ORDER — PROPOFOL 10 MG/ML IV EMUL
INTRAVENOUS | Status: DC | PRN
  Administered 2011-04-25: 200 mg via INTRAVENOUS

## 2011-04-25 MED ORDER — KETOROLAC TROMETHAMINE 30 MG/ML IJ SOLN
INTRAMUSCULAR | Status: DC | PRN
  Administered 2011-04-25 (×2): 30 mg via INTRAVENOUS

## 2011-04-25 MED ORDER — IBUPROFEN 600 MG PO TABS
600.0000 mg | ORAL_TABLET | Freq: Four times a day (QID) | ORAL | Status: DC | PRN
  Administered 2011-04-26: 600 mg via ORAL
  Filled 2011-04-25: qty 1

## 2011-04-25 MED ORDER — ALUM & MAG HYDROXIDE-SIMETH 200-200-20 MG/5ML PO SUSP: 30.0000 mL | ORAL | Status: DC | PRN

## 2011-04-25 MED ORDER — FENTANYL CITRATE 0.05 MG/ML IJ SOLN
INTRAMUSCULAR | Status: AC
  Filled 2011-04-25: qty 2

## 2011-04-25 MED ORDER — FENTANYL CITRATE 0.05 MG/ML IJ SOLN
INTRAMUSCULAR | Status: DC | PRN
  Administered 2011-04-25 (×2): 100 ug via INTRAVENOUS
  Administered 2011-04-25: 150 ug via INTRAVENOUS

## 2011-04-25 MED ORDER — ROCURONIUM BROMIDE 100 MG/10ML IV SOLN
INTRAVENOUS | Status: DC | PRN
  Administered 2011-04-25 (×3): 10 mg via INTRAVENOUS
  Administered 2011-04-25: 20 mg via INTRAVENOUS
  Administered 2011-04-25 (×3): 10 mg via INTRAVENOUS

## 2011-04-25 MED ORDER — FENTANYL CITRATE 0.05 MG/ML IJ SOLN: 25.0000 ug | INTRAMUSCULAR | Status: DC | PRN

## 2011-04-25 MED ORDER — BUPIVACAINE HCL (PF) 0.25 % IJ SOLN
INTRAMUSCULAR | Status: DC | PRN
  Administered 2011-04-25: 30 mL

## 2011-04-25 MED ORDER — LIDOCAINE HCL (CARDIAC) 20 MG/ML IV SOLN
INTRAVENOUS | Status: AC
  Filled 2011-04-25: qty 5

## 2011-04-25 MED ORDER — LABETALOL HCL 5 MG/ML IV SOLN
20.0000 mg | Freq: Once | INTRAVENOUS | Status: AC
  Administered 2011-04-25: 20 mg via INTRAVENOUS

## 2011-04-25 MED ORDER — PROMETHAZINE HCL 25 MG/ML IJ SOLN
12.5000 mg | Freq: Once | INTRAMUSCULAR | Status: AC
  Administered 2011-04-25: 12.5 mg via INTRAVENOUS
  Filled 2011-04-25: qty 1

## 2011-04-25 MED ORDER — HYDROCHLOROTHIAZIDE 25 MG PO TABS
25.0000 mg | ORAL_TABLET | Freq: Every day | ORAL | Status: AC
  Administered 2011-04-25: 25 mg via ORAL
  Filled 2011-04-25: qty 1

## 2011-04-25 MED ORDER — SODIUM CHLORIDE 0.9 % IV SOLN: 0.1000 mg/kg | Freq: Once | INTRAVENOUS | Status: DC | PRN

## 2011-04-25 MED ORDER — ONDANSETRON HCL 4 MG/2ML IJ SOLN
4.0000 mg | Freq: Four times a day (QID) | INTRAMUSCULAR | Status: DC | PRN
  Administered 2011-04-25: 4 mg via INTRAVENOUS
  Filled 2011-04-25: qty 2

## 2011-04-25 MED ORDER — PROPOFOL 10 MG/ML IV EMUL
INTRAVENOUS | Status: AC
  Filled 2011-04-25: qty 20

## 2011-04-25 MED ORDER — DOCUSATE SODIUM 100 MG PO CAPS
100.0000 mg | ORAL_CAPSULE | Freq: Every day | ORAL | Status: DC
  Administered 2011-04-26: 100 mg via ORAL
  Filled 2011-04-25: qty 1

## 2011-04-25 MED ORDER — STERILE WATER FOR IRRIGATION IR SOLN
Status: DC | PRN
  Administered 2011-04-25: 150 mL

## 2011-04-25 MED ORDER — TEMAZEPAM 15 MG PO CAPS: 15.0000 mg | ORAL_CAPSULE | Freq: Every evening | ORAL | Status: DC | PRN

## 2011-04-25 MED ORDER — SENNOSIDES-DOCUSATE SODIUM 8.6-50 MG PO TABS: 2.0000 | ORAL_TABLET | Freq: Every day | ORAL | Status: DC | PRN

## 2011-04-25 MED ORDER — DEXAMETHASONE SODIUM PHOSPHATE 4 MG/ML IJ SOLN
INTRAMUSCULAR | Status: DC | PRN
  Administered 2011-04-25: 10 mg via INTRAVENOUS

## 2011-04-25 MED ORDER — PROMETHAZINE HCL 25 MG/ML IJ SOLN: 12.5000 mg | Freq: Four times a day (QID) | INTRAMUSCULAR | Status: DC | PRN

## 2011-04-25 MED ORDER — GLYCOPYRROLATE 0.2 MG/ML IJ SOLN
INTRAMUSCULAR | Status: AC
  Filled 2011-04-25: qty 3

## 2011-04-25 MED ORDER — DEXAMETHASONE SODIUM PHOSPHATE 10 MG/ML IJ SOLN
INTRAMUSCULAR | Status: AC
  Filled 2011-04-25: qty 1

## 2011-04-25 SURGICAL SUPPLY — 63 items
BAG URINE DRAINAGE (UROLOGICAL SUPPLIES) ×2 IMPLANT
BARRIER ADHS 3X4 INTERCEED (GAUZE/BANDAGES/DRESSINGS) ×2 IMPLANT
BENZOIN TINCTURE PRP APPL 2/3 (GAUZE/BANDAGES/DRESSINGS) ×2 IMPLANT
BLADE LAP MORCELLATOR 15X9.5 (ELECTROSURGICAL) ×2 IMPLANT
BLADELESS LONG 8MM (BLADE) ×2 IMPLANT
CABLE HIGH FREQUENCY MONO STRZ (ELECTRODE) ×2 IMPLANT
CATH FOLEY 3WAY  5CC 16FR (CATHETERS) ×1
CATH FOLEY 3WAY  5CC 18FR (CATHETERS)
CATH FOLEY 3WAY 5CC 16FR (CATHETERS) ×1 IMPLANT
CATH FOLEY 3WAY 5CC 18FR (CATHETERS) IMPLANT
CHLORAPREP W/TINT 26ML (MISCELLANEOUS) ×2 IMPLANT
CLOTH BEACON ORANGE TIMEOUT ST (SAFETY) ×2 IMPLANT
CONT PATH 16OZ SNAP LID 3702 (MISCELLANEOUS) ×2 IMPLANT
COVER MAYO STAND STRL (DRAPES) ×2 IMPLANT
COVER TABLE BACK 60X90 (DRAPES) ×4 IMPLANT
COVER TIP SHEARS 8 DVNC (MISCELLANEOUS) ×1 IMPLANT
COVER TIP SHEARS 8MM DA VINCI (MISCELLANEOUS) ×1
DECANTER SPIKE VIAL GLASS SM (MISCELLANEOUS) ×2 IMPLANT
DERMABOND ADVANCED (GAUZE/BANDAGES/DRESSINGS) ×2 IMPLANT
DRAPE HUG U DISPOSABLE (DRAPE) ×2 IMPLANT
DRAPE LG THREE QUARTER DISP (DRAPES) ×4 IMPLANT
DRAPE MONITOR DA VINCI (DRAPE) ×2 IMPLANT
DRAPE WARM FLUID 44X44 (DRAPE) ×2 IMPLANT
ELECT REM PT RETURN 9FT ADLT (ELECTROSURGICAL) ×2
ELECTRODE REM PT RTRN 9FT ADLT (ELECTROSURGICAL) ×1 IMPLANT
EVACUATOR SMOKE 8.L (FILTER) ×2 IMPLANT
GAUZE VASELINE 3X9 (GAUZE/BANDAGES/DRESSINGS) IMPLANT
GLOVE BIO SURGEON STRL SZ 6.5 (GLOVE) ×4 IMPLANT
GLOVE ECLIPSE 6.5 STRL STRAW (GLOVE) ×6 IMPLANT
GLOVE SURG SS PI 6.5 STRL IVOR (GLOVE) ×6 IMPLANT
GOWN BRE IMP SLV AUR LG STRL (GOWN DISPOSABLE) ×8 IMPLANT
KIT DISP ACCESSORY 4 ARM (KITS) ×2 IMPLANT
NS IRRIG 1000ML POUR BTL (IV SOLUTION) ×6 IMPLANT
OCCLUDER COLPOPNEUMO (BALLOONS) ×2 IMPLANT
PACK LAVH (CUSTOM PROCEDURE TRAY) ×2 IMPLANT
PAD PREP 24X48 CUFFED NSTRL (MISCELLANEOUS) ×4 IMPLANT
POSITIONER SURGICAL ARM (MISCELLANEOUS) ×4 IMPLANT
SET CYSTO W/LG BORE CLAMP LF (SET/KITS/TRAYS/PACK) IMPLANT
SET IRRIG TUBING LAPAROSCOPIC (IRRIGATION / IRRIGATOR) ×2 IMPLANT
SOLUTION ELECTROLUBE (MISCELLANEOUS) ×2 IMPLANT
SPONGE LAP 18X18 X RAY DECT (DISPOSABLE) IMPLANT
STRIP CLOSURE SKIN 1/2X4 (GAUZE/BANDAGES/DRESSINGS) ×2 IMPLANT
SUT MNCRL AB 3-0 PS2 27 (SUTURE) ×4 IMPLANT
SUT VIC AB 0 CT1 27 (SUTURE) ×6
SUT VIC AB 0 CT1 27XBRD ANTBC (SUTURE) ×6 IMPLANT
SUT VIC AB 0 CT2 27 (SUTURE) IMPLANT
SUT VIC AB 2-0 CT2 27 (SUTURE) ×2 IMPLANT
SUT VICRYL 0 UR6 27IN ABS (SUTURE) ×4 IMPLANT
SYR 50ML LL SCALE MARK (SYRINGE) ×2 IMPLANT
SYSTEM CONVERTIBLE TROCAR (TROCAR) ×2 IMPLANT
TIP UTERINE 5.1X6CM LAV DISP (MISCELLANEOUS) IMPLANT
TIP UTERINE 6.7X10CM GRN DISP (MISCELLANEOUS) IMPLANT
TIP UTERINE 6.7X6CM WHT DISP (MISCELLANEOUS) IMPLANT
TIP UTERINE 6.7X8CM BLUE DISP (MISCELLANEOUS) ×2 IMPLANT
TOWEL OR 17X24 6PK STRL BLUE (TOWEL DISPOSABLE) ×6 IMPLANT
TROCAR 12M 150ML BLUNT (TROCAR) ×2 IMPLANT
TROCAR DISP BLADELESS 8 DVNC (TROCAR) ×1 IMPLANT
TROCAR DISP BLADELESS 8MM (TROCAR) ×1
TROCAR XCEL 12X100 BLDLESS (ENDOMECHANICALS) ×2 IMPLANT
TROCAR Z-THREAD BLADED 12X100M (TROCAR) ×2 IMPLANT
TUBING FILTER THERMOFLATOR (ELECTROSURGICAL) ×2 IMPLANT
WARMER LAPAROSCOPE (MISCELLANEOUS) ×2 IMPLANT
WATER STERILE IRR 1000ML UROMA (IV SOLUTION) ×2 IMPLANT

## 2011-04-25 NOTE — Addendum Note (Signed)
Addendum  created 04/25/11 1552 by Tyrone Apple. [employer] edited:Orders, [employer] Based Tax adviser

## 2011-04-25 NOTE — H&P (View-Only) (Signed)
NAME:  [name], [name]                    ACCOUNT NO.:  [id number]  MEDICAL RECORD NO.:  [id number]  LOCATION:  [facility]                           FACILITY:  [facility]  PHYSICIAN:  Sandra A. Rivard, M.D. DATE OF BIRTH:  05/05/1961  DATE OF ADMISSION:  04/08/2011 DATE OF DISCHARGE:  04/08/2011                             HISTORY & PHYSICAL   HISTORY OF PRESENT ILLNESS:  [name] is a 49-year-old single black female para 1-0-2-1 presenting for robotically-assisted supracervical hysterectomy because of symptomatic uterine fibroids.  This patient is status post myomectomy (1995) who for the past several years has had vaginal bleeding that may last up to 2 weeks and require her to change a pad twice an hour or every 2 hours.  Additionally, the patient has  large clots and menstrual cramps that she rates as a 10/10 on a 10-point pain scale.  The patient is able to achieve some relief from her cramping by taking ibuprofen 800 mg decreasing her pain to 5/10 on a 10- point pain scale.  In February 2012, her hemoglobin/hematocrit was 9.8/33.3 respectively and she had an endometrial biopsy that did not show any hyperplasia, atypia, or malignancy.  The patient was given in May 2012 Lupron Depot 11.25 mg IM and since that time she has been amenorrheic.  She goes on to say, however, that she does experience some mild pelvic discomfort around the time she normally would have a menstrual period.  The patient denies any difficulty urinating, any changes in her bowel habits, vaginitis symptoms, dyspareunia, or back pain.  A pelvic ultrasound done prior to receiving Lupron Depot showed a uterus measuring 18.0 x 13.1 x 8.93 cm and a posterior fibroid measuring 14.6 x 12.3 x 9.86 cm.  Both of the patient's ovaries appeared normal on that study.  In May 2012, after having been on Lupron for 2 months, her uterus decreased to 15.7 cm x 11.5 cm x 9.30 cm and her posterior fibroid decreased to 10.4 x 10.8 x 9.47 cm.  Again, the  patient's ovaries appeared normal on this study.  Given the protracted nature of the patient's vaginal bleeding and the size of her uterine fibroid, she has consented to proceed with definitive therapy in the form of a robotically- assisted supra-cervical hysterectomy.  PAST MEDICAL HISTORY:  OB History:  Gravida 3, para 1-0-2-1.  The patient had cesarean section in 2000.  GYN History:  Menarche 50 years old.  Last menstrual period March 2012.  She uses abstinence as her method of contraception.  She denies any history of sexually transmitted diseases or abnormal Pap smears.  Her last normal Pap smear was February 2012.    Past Medical History:  Hypertension, anemia, blood transfusion due to menorrhagia, myasthenia gravis, hypercalcemia, herpes simplex virus #2, H. pylori, and vitamin D deficiency.  SURGICAL HISTORY:  1975 appendectomy, 1995 myomectomy.  Denies any problems with anesthesia.  FAMILY HISTORY:  Thyroid disease, lung cancer, cardiovascular disease, diabetes mellitus, hypertension, and migraines.  SOCIAL HISTORY:  The patient is single and she works in [employer].    Habits:  She does not use tobacco or illicit drugs.  She rarely   consumes alcohol.  CURRENT MEDICATIONS: 1. Lisinopril/HCTZ 20/25 mg daily 2. Vitamin D 50,000 units twice weekly. 3. Omeprazole 40 mg daily as needed.  ALLERGIES:  The patient does have a sensitivity to CODEINE that causes GI upset.  She denies any sensitivities to peanuts, soy, or shellfish, but is uncertain as to whether she has any latex sensitivity   [the patient is to began on April 23, 2011, Mestinon 180 mg extended release Daily for her Myasthenia Gravis].  REVIEW OF SYSTEMS:  The patient does wear glasses.  She admits to arthralgias and eyelid weakness.  She denies any chest pain, shortness of breath, headache, vision changes, myalgias, dysphagia, nasal congestion, nausea, vomiting, diarrhea, urinary tract  symptoms, unilateral weakness, paresthesias, or extremity muscle weakness.  PHYSICAL EXAM:  VITAL SIGNS:  Blood pressure 140/80, pulse is 78, respirations 16, temperature 98.0 degrees Fahrenheit orally, weight 203 pounds, height 5 feet 6 inches tall, body mass index 32.8.  NECK:  Supple without masses.  There is no thyromegaly or cervical adenopathy. HEART:  Regular rate and rhythm. LUNGS:  Clear. BACK:  No CVA tenderness. ABDOMEN:  Tender in the right lower quadrant, but no rebound, though there is some voluntary guarding.  There is a firm mass arising from the pelvis to approximately three fingerbreadths below the umbilicus.  There is no organomegaly. EXTREMITIES:  No clubbing, cyanosis or edema. PELVIC:  EGBUS is normal.  Vagina is normal.  Cervix is nontender without lesions.  Uterus appears 20-22 weeks' size with diffuse tenderness.  Adnexa without any separable masses.  IMPRESSION:  Symptomatic uterine fibroids.  DISPOSITION:  A discussion was held with the patient regarding indications for her procedures along with their risks which include, but are not limited to reaction to anesthesia, damage to adjacent organs, infection, excessive bleeding, and a possible need for an open abdominal incision.  The patient further understands that hospital stay is expected to be 0-2 days and that she is expected to return to her activities within 2-3 weeks.  The patient also was informed that the robotic approach requires more time than that of an open abdominal incision and she will receive and complete a MiraLax bowel prep 24 hours prior to her procedure.  The patient has consented to proceed with the robotically-assisted supracervical hysterectomy at [facility] on April 25, 2011, at 7:30 a.m.     Garrette Caine J. Adrina Armijo, P.A.-C   ______________________________ Sandra A. Rivard, M.D.    EJP/MEDQ  D:  04/23/2011  T:  04/24/2011  Job:  [id number] 

## 2011-04-25 NOTE — Interval H&P Note (Signed)
History and Physical Interval Note:   04/25/2011   7:28 AM   [name]  has presented today for surgery, with the diagnosis of uterine fibroid  The various methods of treatment have been discussed with the patient and family. After consideration of risks, benefits and other options for treatment, the patient has consented to  Procedure(s): ROBOTIC ASSISTED SUPRACERVICAL HYSTERECTOMY as a surgical intervention .  I have reviewed the patients' chart and labs.  Questions were answered to the patient's satisfaction.     Christna Kulick A  MD     No interval change in history and physical exam.  Keira Bohlin A MD 04/25/2011 7:29 AM

## 2011-04-25 NOTE — Anesthesia Procedure Notes (Addendum)
Procedure Name: Intubation Date/Time: 04/25/2011 8:05 AM Performed by: Karleen Dolphin Pre-anesthesia Checklist: Patient identified, Emergency Drugs available, Suction available, Timeout performed and Patient being monitored Patient Re-evaluated:Patient Re-evaluated prior to inductionOxygen Delivery Method: Circle System Utilized Preoxygenation: Pre-oxygenation with 100% oxygen Intubation Type: IV induction and Inhalational induction Ventilation: Mask ventilation without difficulty Laryngoscope Size: Mac and 3 Grade View: Grade I Tube type: Oral Tube size: 7.0 mm Number of attempts: 1 Airway Equipment and Method: stylet Placement Confirmation: ETT inserted through vocal cords under direct vision,  positive ETCO2 and breath sounds checked- equal and bilateral Secured at: 20 cm Tube secured with: Tape Dental Injury: Teeth and Oropharynx as per pre-operative assessment

## 2011-04-25 NOTE — Discharge Summary (Signed)
Physician Discharge Summary  Patient ID: [name] MRN: [id number] DOB/AGE: May 19, 1961 50 y.o.  Admit date: 04/25/2011 Discharge date: 04/25/2011  Admission Diagnoses: UTERINE FIBROID uterine fibroid  Discharge Diagnoses: UTERINE FIBROID uterine fibroid        Active Problems:  Leiomyoma of uterus, unspecified   Discharged Condition: good  Hospital Course:   Consults: none  Treatments: surgery: Robotically assisted supracervical hysterectomy  Disposition: home   Current Discharge Medication List    START taking these medications   Details  oxyCODONE-acetaminophen (ROXICET) 5-325 MG per tablet Take 1 tablet by mouth every 4 (four) hours as needed for pain. Qty: 30 tablet, Refills: 0      CONTINUE these medications which have NOT CHANGED   Details  Calcium Citrate-Vitamin D (CALCIUM CITRATE + D PO) Take 2 tablets by mouth daily.      ibuprofen (ADVIL,MOTRIN) 800 MG tablet Take 800-1,600 mg by mouth every 8 (eight) hours as needed. PATIENT TAKES FOR PAIN     lisinopril-hydrochlorothiazide (PRINZIDE,ZESTORETIC) 20-25 MG per tablet Take 1 tablet by mouth daily.      multivitamin (THERAGRAN) per tablet Take 1 tablet by mouth daily.      pyridostigmine (MESTINON) 180 MG CR tablet Take 180 mg by mouth daily.           SignedSilverio Lay A 04/25/2011, 2:30 PM

## 2011-04-25 NOTE — Anesthesia Postprocedure Evaluation (Signed)
  Anesthesia Post-op Note  Patient: [name]  Procedure(s) Performed:  ROBOTIC ASSISTED SUPRACERVICAL HYSTERECTOMY  Patient Location: PACU  Anesthesia Type: General   Level of Consciousness: awake, oriented and sedated  Airway and Oxygen Therapy: Patient Spontanous Breathing and Patient connected to nasal cannula oxygen  Post-op Pain: none  Post-op Assessment: Post-op Vital signs reviewed, Patient's Cardiovascular Status Stable, Respiratory Function Stable, Patent Airway and No signs of Nausea or vomiting  Post-op Vital Signs: Reviewed and stable  Complications: No apparent anesthesia complications

## 2011-04-25 NOTE — OR Nursing (Signed)
rumi insertion [id number]; docking [id number], console at 0902.

## 2011-04-25 NOTE — Brief Op Note (Signed)
              04/25/2011  1:54 PM  PATIENT:  [name]  50 y.o. female  PRE-OPERATIVE DIAGNOSIS:  uterine fibroid  POST-OPERATIVE DIAGNOSIS:  same  PROCEDURE:  Procedure(s): ROBOTIC ASSISTED SUPRACERVICAL HYSTERECTOMY with lysis of adhesions and uterine morcellation  SURGEON:  Surgeon(s): Crist Fat Hardie Veltre MD  ASSISTANTS: Henreitta Leber PA-C   ANESTHESIA:   general  ESTIMATED BLOOD LOSS:  50 ml   LOCAL MEDICATIONS USED:  MARCAINE 15 CC  SPECIMEN:  Uterine fundus = 675.6 grams  DISPOSITION OF SPECIMEN:  PATHOLOGY  COUNTS:  YES  EBL:  50 ml  DICTATION #: [id number]   PATIENT DISPOSITION:  PACU - hemodynamically stable.    WGNFAO,ZHYQMV A MD 04/25/2011 1:54 PM

## 2011-04-25 NOTE — Transfer of Care (Signed)
Immediate Anesthesia Transfer of Care Note  Patient: [name]  Procedure(s) Performed:  ROBOTIC ASSISTED SUPRACERVICAL HYSTERECTOMY  Patient Location: PACU  Anesthesia Type: General  Level of Consciousness: awake, alert  and oriented  Airway & Oxygen Therapy: Patient Spontanous Breathing and Patient connected to nasal cannula oxygen  Post-op Assessment: Report given to PACU RN and Post -op Vital signs reviewed and stable  Post vital signs: Reviewed and stable  Complications: No apparent anesthesia complications

## 2011-04-26 LAB — DIFFERENTIAL
Basophils Absolute: 0 10*3/uL (ref 0.0–0.1)
Basophils Relative: 0 % (ref 0–1)
Eosinophils Relative: 0 % (ref 0–5)
Monocytes Absolute: 1 10*3/uL (ref 0.1–1.0)
Neutro Abs: 7.4 10*3/uL (ref 1.7–7.7)

## 2011-04-26 LAB — CBC
HCT: 31.8 % — ABNORMAL LOW (ref 36.0–46.0)
MCHC: 31.1 g/dL (ref 30.0–36.0)
Platelets: 236 10*3/uL (ref 150–400)
RDW: 19.7 % — ABNORMAL HIGH (ref 11.5–15.5)

## 2011-04-26 MED ORDER — OXYCODONE-ACETAMINOPHEN 5-325 MG PO TABS
1.0000 | ORAL_TABLET | ORAL | Status: DC | PRN
  Administered 2011-04-26: 1 via ORAL
  Filled 2011-04-26: qty 1

## 2011-04-26 MED ORDER — LISINOPRIL 20 MG PO TABS
20.0000 mg | ORAL_TABLET | Freq: Every day | ORAL | Status: DC
  Administered 2011-04-26: 20 mg via ORAL
  Filled 2011-04-26 (×2): qty 1

## 2011-04-26 MED ORDER — LACTATED RINGERS IV SOLN: INTRAVENOUS | Status: DC

## 2011-04-26 MED ORDER — PROMETHAZINE HCL 12.5 MG PO TABS: 25.0000 mg | ORAL_TABLET | Freq: Four times a day (QID) | ORAL | Status: AC | PRN

## 2011-04-26 MED ORDER — SCOPOLAMINE 1 MG/3DAYS TD PT72
1.0000 | MEDICATED_PATCH | Freq: Once | TRANSDERMAL | Status: DC
  Filled 2011-04-26: qty 1

## 2011-04-26 MED ORDER — HYDROCHLOROTHIAZIDE 25 MG PO TABS
25.0000 mg | ORAL_TABLET | Freq: Every day | ORAL | Status: DC
  Administered 2011-04-26: 25 mg via ORAL
  Filled 2011-04-26 (×2): qty 1

## 2011-04-26 NOTE — Plan of Care (Signed)
Problem: Phase II Progression Outcomes Goal: Progress activity as tolerated unless otherwise ordered Outcome: Completed/Met Date Met:  04/26/11 OOB,tolerating well.

## 2011-04-26 NOTE — Progress Notes (Signed)
  1 Day Post-Op Procedure(s) (LRB): ROBOTIC ASSISTED SUPRACERVICAL HYSTERECTOMY (N/A)  Subjective: Patient reports left back pain, mainly lower. Tolerating ice chips only. Battled nausea all night.  diet without difficulty. No nausea / vomiting.  Ambulating and voiding.  Objective: BP 157/81  Pulse 88  Temp(Src) 98 F (36.7 C) (Oral)  Resp 18  SpO2 95% Lungs: clear Heart: normal rate and rhythm Abdomen:soft and appropriately tender, normal bowel sounds. No CVAT. Extremities: Homans sign is negative, no sign of DVT Incision: all normal except for oozing at upper left trocar site.  Assessment: s/p Procedure(s): ROBOTIC ASSISTED SUPRACERVICAL HYSTERECTOMY: stable  Plan: Advance diet Discontinue IV fluids Discharge home this afternoon. Will add phenergan 12.5-25 mg every 6 hours prn. Operative findings reviewed. Questions answered.  LOS: 1 day    Etrulia Zarr A 04/26/2011, 8:37 AM

## 2011-04-26 NOTE — Plan of Care (Signed)
Problem: Phase I Progression Outcomes Goal: Pain controlled with appropriate interventions Outcome: Progressing Good pain control with Dilaudid IV,anticipate PO. Goal: Dangle/OOB as tolerated per MD order Outcome: Completed/Met Date Met:  04/26/11 OOB to bathroom with stand-by assist,tolerating well. Goal: VS, stable, temp < 100.4 degrees F Outcome: Progressing   Blood Pressure, still with some high readings. Goal: I & O every 4 hrs or as ordered Outcome: Progressing  Voiding adequate well.  Starting to tolerate PO liquids.

## 2011-04-26 NOTE — Op Note (Signed)
NAMESABRINE, PATCHEN                    ACCOUNT NO.:  [id number]  MEDICAL RECORD NO.:  [id number]  LOCATION:  9312                          FACILITY:  [facility]  PHYSICIAN:  Crist Fat. Donelda Mailhot, M.D. DATE OF BIRTH:  29-Jun-1961  DATE OF PROCEDURE: DATE OF DISCHARGE:                              OPERATIVE REPORT   PREOPERATIVE DIAGNOSIS:  Symptomatic uterine fibroids.  POSTOPERATIVE DIAGNOSIS:  Symptomatic uterine fibroids with pelvic adhesions.  ANESTHESIA:  General.  Dr. Malen Gauze.  PROCEDURE:  Robotically assisted supracervical hysterectomy.  SURGEON:  Crist Fat. Karlton Maya, MD.  ASSISTANTCaffie Damme, physician's assistant.  ESTIMATED BLOOD LOSS:  50 mL.  PROCEDURE:  After being informed of the planned procedure with possible complications including bleeding, infection, injury to bladder, bowels or ureters, need for laparotomy, expected hospital stay, expected recovery, informed consent was obtained.  The patient was taken to OR #7, given general anesthesia with endotracheal intubation without any complication.  Of note, the patient required the same amount of muscle relaxant as a patient without myasthenias gravis, so no complication to induce anesthesia.  She was placed in the lithotomy position on a sticky mattress and beanbag.  Both arms are padded and tucked on each side with knee-high sequential compressive devices.  She was prepped she was prepped and draped in a sterile fashion and a Foley catheter was inserted in her bladder.  Pelvic exam reveals an enlarged uterus approximately 20 weeks in size with poor mobility.  A weighted speculum was inserted in the vagina.  Anterior lip of the cervix was grasped with a tenaculum forceps and the uterus was sounded at 9 cm which allows easy placement of the #8 RUMI intrauterine manipulator with a 2.0 KOH ring. The RUMI balloon was inflated with 10 mL of saline and the speculum was removed.  We measured 12 cm above the fundus which was  approximately 10 cm above the umbilicus to place our 10-mm port.  This area was infiltrated with 10 mL of Marcaine 0.25 and we performed a vertical incision which was brought down bluntly to the fascia.  The fascia was grasped with 2 tenaculum forceps, incised with Mayo scissors.  Peritoneum was entered bluntly.  A pursestring suture of 0 Vicryl was placed on the fascia and a 10-mm Hassan trocar was easily inserted in the abdominal cavity held in place with the pursestring suture.  This allows for easy insufflation of a pneumoperitoneum using warmed CO2 at a maximum pressure of 15 mmHg. A quick observation reveals a greatly enlarged uterus with multiple fibroids.  A grade 4 thick adhesion between the fundus and the anterior abdominal wall.  Adhesions between the sigmoid colon fundus and the left cornua.  We decided on all trocar placement and position 2 8 mm robotic trocar on the left one, 8-mm right robotic trocar on the right, and a 10-mm patient-side assistant trocar on the right all under direct visualization after infiltrating with Marcaine 0.25 and we then left side dock the robot.  Preparation and docking was completed in 48 minutes.  We placed a monopolar scissor in arm #1 PK gyrus forceps in arm #2 and a Cobra forceps in arm #  3.  Console time starts at 9:05.  We start with sharp lysis of adhesion of the adhesion with the fundus and the abdominal wall as well as the sigmoid colon with the fundus of the uterus, left cornua and the left pelvic wall.  Once this was freed, we can now see the whole uterus and go all the way down to the posterior cul-de-sac which was free of adhesions.  We also see 2 normal tubes, 2 normal ovaries.  Appendix was not visualized.  Liver was seen and normal.  Gallbladder was not visualized.  We start with the left side cauterizing the round ligaments and sectioning it, cauterizing the tube and sectioning it, cauterizing the utero-ovarian ligament  and sectioning it.  This gives [place] an easy access for the posterior leaf of the broad ligament which was sharply dissected all the way to the uterosacral ligament.  Having visualized the position of the ureter which was very low in the pelvis.  We then opened the anterior leaf of the broad ligament and try to go in the anterior cul-de- sac, but this appears to be difficult, we are unable to locate the cervix anteriorly due to the bulkiness of the uterus and we are also unable to clearly identify the bladder margins.  Because of that, we filled the bladder with 200 mL of saline and now note that the bladder was adherent to the lower uterine segment.  We are able to find a plane opened this sharply, opened the broad ligament sharply with hot scissors and systematically dissected the bladder down.  Once a plane was identified, this dissection was easy.  We then dissect the bladder down all the way below the KOH ring.  We skeletonized the uterine vessels which are of a significant caliber and cauterized them using PK forceps. We then proceeded on the right side by cauterizing the tube and sectioning it, cauterizing the utero-ovarian ligament and sectioning it, cauterizing the round ligament and sectioning it, again the posterior leaf of the broad ligament was easily accessible and we can sharply dissected all the way to the uterosacral ligament, having previously visualized the ureter which was also very low in the pelvis.  We then opened the anterior leaf of the broad ligament and proceed again with systematic dissection of the bladder below the KOH ring and skeletonization of the uterine vessels.  At this time, using pressure on the KOH ring with the bladder that was dissected below the KOH ring we cauterized the right uterine artery, right uterine vein at the level of the KOH ring using PK gyrus forceps, ureter being below our site of cauterization and we then sectioned those vessels.  We  returned to the left side and have to skeletonize a little bit more to finally isolate the vascular bundle with pressure on the KOH ring we cauterized the uterine artery in its ascending branch at the level of the KOH ring using PK gyrus forceps and we sectioned these vessels.  Now, we have to lift the uterus up and try to identify the cervix which was very small compared to the large posterior fibroid.  Nevertheless, we are able to start our dissection of the cervix from the lower uterine segment using open monopolar scissors.  This was done systematically until the RUMI intrauterine manipulator was visualized and done in a circular circumferential way uterus was completely released.  Some bleeding on the right vascular bundle was cauterized with pressure on the KOH ring, and still keeping the  ureter away from our site of cauterization.  We then irrigate profusely with warm saline, noticed satisfactory hemostasis.  The RUMI intrauterine manipulator was removed and replaced with a sponge on a stick in the vagina pushing the cervix back up.  We can now systematically cauterized the endocervix using both monopolar and bipolar cauterization.  Verifying hemostasis again was confirmed to be adequate.  Both ureters were visualized with good peristalsis.  No dilatation.  Please note that the bladder was completely emptied before cauterization of the vascular bundle.  We then undocked the robot for a console time of 3 hours and 5 minutes.  The 10-mm patient-side assistant trocar was replaced for the morcellator under direct visualization and we then proceed with systematic morcellation of the specimen which takes an hour and 15 minutes.  We irrigated profusely with warm saline, noticed satisfactory hemostasis and put an Intercede sheet on top of the cervical stump in the anterior and posterior cul-de-sac.  All trocars are then removed after evacuating the pneumoperitoneum.  The fascia of  the supraumbilical incision was closed with a pursestring suture of 0 Vicryl.  The fascia of the patient side assistant trocar was closed with a figure-of-eight stitch of 0 Vicryl.  The skin of the supraumbilical incision was closed with this subcuticular suture of 3-0 Monocryl and Dermabond.  Skin of all 4 other trocars were closed with Dermabond.  A speculum exam reveals a normal cervix with no active bleeding.  Instrument and sponge counts were complete x2.  Estimated blood loss was 50 mL.  The procedure was very well tolerated by the patient who was taken to recovery room and should be discharged home today in a well and stable condition.  SPECIMEN:  Morcellated uterus without the cervix weighing 675 g sent to pathology.     Crist Fat Javia Dillow, M.D.     SAR/MEDQ  D:  04/25/2011  T:  04/26/2011  Job:  [id number]

## 2011-07-22 LAB — I-STAT 8, (EC8 V) (CONVERTED LAB)
Acid-base deficit: 2
BUN: 13
Bicarbonate: 23.9
Chloride: 107
Glucose, Bld: 91
HCT: 34 — ABNORMAL LOW
Hemoglobin: 11.6 — ABNORMAL LOW
Operator id: 284251
Potassium: 4
Sodium: 139
TCO2: 25
pCO2, Ven: 45.8
pH, Ven: 7.325 — ABNORMAL HIGH

## 2011-07-22 LAB — POCT I-STAT CREATININE
Creatinine, Ser: 0.8
Operator id: 284251

## 2011-07-25 LAB — I-STAT 8, (EC8 V) (CONVERTED LAB)
Acid-base deficit: 1
Chloride: 106
Glucose, Bld: 74
Hemoglobin: 12.2
Potassium: 4
Sodium: 140
pH, Ven: 7.302 — ABNORMAL HIGH

## 2011-07-25 LAB — URINALYSIS, ROUTINE W REFLEX MICROSCOPIC
Nitrite: NEGATIVE
Protein, ur: NEGATIVE
Specific Gravity, Urine: 1.021
Urobilinogen, UA: 0.2

## 2011-07-25 LAB — POCT CARDIAC MARKERS
Myoglobin, poc: 38.6
Operator id: 151321

## 2012-01-28 ENCOUNTER — Telehealth: Payer: Self-pay | Admitting: Obstetrics and Gynecology

## 2012-01-30 NOTE — Telephone Encounter (Signed)
MSG ROUTED TO LD, IS SR PT.

## 2012-02-03 NOTE — Telephone Encounter (Signed)
LM for pt that SR is not in the office on 02-11-12.  ld

## 2012-02-04 NOTE — Telephone Encounter (Signed)
LM for pt that SR is not in office on 02-11-12 ld

## 2012-03-04 ENCOUNTER — Ambulatory Visit (INDEPENDENT_AMBULATORY_CARE_PROVIDER_SITE_OTHER): Payer: 59 | Admitting: Obstetrics and Gynecology

## 2012-03-04 ENCOUNTER — Encounter: Payer: Self-pay | Admitting: Obstetrics and Gynecology

## 2012-03-04 VITALS — BP 126/78 | Ht 66.0 in | Wt 206.0 lb

## 2012-03-04 DIAGNOSIS — N951 Menopausal and female climacteric states: Secondary | ICD-10-CM

## 2012-03-04 DIAGNOSIS — D259 Leiomyoma of uterus, unspecified: Secondary | ICD-10-CM

## 2012-03-04 MED ORDER — ESTRADIOL 1 MG PO TABS: 1.0000 mg | ORAL_TABLET | Freq: Every day | ORAL | Status: DC

## 2012-03-04 NOTE — Progress Notes (Signed)
Patient here for reassurance that cervical fibroid is not growing. S/P robotic supracervical hyst 04/2011 Also would like to change ERT because of cost.  Pelvic: normal cervix, bimanual exam normal  A/P: normal exam  Reassurance  Estradiol 1 mg daily  AEX 04/2012

## 2012-03-18 ENCOUNTER — Other Ambulatory Visit: Payer: Self-pay | Admitting: Obstetrics and Gynecology

## 2012-03-18 MED ORDER — ESTRADIOL 1 MG PO TABS: 1.0000 mg | ORAL_TABLET | Freq: Every day | ORAL | Status: DC

## 2012-03-18 NOTE — Telephone Encounter (Signed)
SR PT 

## 2012-03-18 NOTE — Telephone Encounter (Signed)
Triage/cht received 

## 2012-03-18 NOTE — Telephone Encounter (Signed)
Estradiol refilled to [facility].  Wrong pharmacy was in computer.  Pt notified.  ld

## 2012-04-21 ENCOUNTER — Ambulatory Visit: Payer: Self-pay | Admitting: Obstetrics and Gynecology

## 2012-05-18 ENCOUNTER — Encounter: Payer: 59 | Admitting: Obstetrics and Gynecology

## 2012-06-02 ENCOUNTER — Ambulatory Visit: Payer: 59 | Admitting: Obstetrics and Gynecology

## 2012-08-06 ENCOUNTER — Ambulatory Visit (INDEPENDENT_AMBULATORY_CARE_PROVIDER_SITE_OTHER): Payer: 59 | Admitting: Obstetrics and Gynecology

## 2012-08-06 ENCOUNTER — Encounter: Payer: Self-pay | Admitting: Obstetrics and Gynecology

## 2012-08-06 VITALS — BP 128/76 | Ht 66.0 in | Wt 200.0 lb

## 2012-08-06 DIAGNOSIS — Z1211 Encounter for screening for malignant neoplasm of colon: Secondary | ICD-10-CM

## 2012-08-06 DIAGNOSIS — Z01419 Encounter for gynecological examination (general) (routine) without abnormal findings: Secondary | ICD-10-CM

## 2012-08-06 DIAGNOSIS — N898 Other specified noninflammatory disorders of vagina: Secondary | ICD-10-CM

## 2012-08-06 DIAGNOSIS — R1031 Right lower quadrant pain: Secondary | ICD-10-CM

## 2012-08-06 LAB — HIV ANTIBODY (ROUTINE TESTING W REFLEX): HIV: NONREACTIVE

## 2012-08-06 LAB — CBC
HCT: 38 % (ref 36.0–46.0)
MCHC: 32.6 g/dL (ref 30.0–36.0)
Platelets: 312 10*3/uL (ref 150–400)
RDW: 14.8 % (ref 11.5–15.5)
WBC: 4.1 10*3/uL (ref 4.0–10.5)

## 2012-08-06 LAB — HEPATITIS C ANTIBODY: HCV Ab: NEGATIVE

## 2012-08-06 LAB — POCT WET PREP (WET MOUNT): Clue Cells Wet Prep Whiff POC: NEGATIVE

## 2012-08-06 LAB — RPR

## 2012-08-06 MED ORDER — ESTRADIOL 1 MG PO TABS: 1.0000 mg | ORAL_TABLET | Freq: Every day | ORAL | Status: DC

## 2012-08-06 NOTE — Progress Notes (Signed)
The patient reports:she complains of Itching for 2 weeks, no discharge or odor no other  complaints  Contraception:robotic supracervical hysterectomy. Estrace 1 mg daily  Last mammogram: was normal 2011 Last pap: was normal February  2012  GC/Chlamydia cultures offered: decided HIV/RPR/HbsAg offered:  undecided HSV 1 and 2 glycoprotein offered: undecided  Menstrual cycle : pt had partial  Hysterectomy  Menstrual flow normal: Pt partial   had hysterectomy   Urinary symptoms: none Normal bowel movements: Yes Reports abuse at home: No:  Subjective:    [name] is a 51 y.o. female, G2P1011, who presents for an annual exam.     History   Social History  . Marital Status: Single    Spouse Name: N/A    Number of Children: N/A  . Years of Education: N/A   Social History Main Topics  . Smoking status: Never Smoker   . Smokeless tobacco: Never Used  . Alcohol Use: Yes     occasionally - 1-2 month - wine  . Drug Use: No  . Sexually Active: Not Currently    Birth Control/ Protection: Surgical     hyst   Other Topics Concern  . None   Social History Narrative  . None    Menstrual cycle:   LMP: Patient's last menstrual period was 01/04/2011.           Cycle: none  The following portions of the patient's history were reviewed and updated as appropriate: allergies, current medications, past family history, past medical history, past social history, past surgical history and problem list.  Review of Systems Pertinent items are noted in HPI. Breast:Negative for breast lump,nipple discharge or nipple retraction Gastrointestinal: Negative for abdominal pain, change in bowel habits or rectal bleeding Urinary:negative   Objective:    BP 128/76  Ht 5\' 6"  (1.676 m)  Wt 200 lb (90.719 kg)  BMI 32.28 kg/m2  LMP 01/04/2011    Weight:  Wt Readings from Last 1 Encounters:  08/06/12 200 lb (90.719 kg)          BMI: Body mass index is 32.28 kg/(m^2).  General Appearance:  Alert, appropriate appearance for age. No acute distress HEENT: Grossly normal Neck / Thyroid: Supple, no masses, nodes or enlargement Lungs: clear to auscultation bilaterally Back: No CVA tenderness Breast Exam: No masses or nodes.No dimpling, nipple retraction or discharge. Cardiovascular: Regular rate and rhythm. S1, S2, no murmur Gastrointestinal: Soft, non-tender, no masses or organomegaly Pelvic Exam: Vulva and vagina appear normal.Cervix is normal. Bimanual exam reveals normal adnexa but right is tender.Uterus surgically absent Rectovaginal: normal rectal, no masses Lymphatic Exam: Non-palpable nodes in neck, clavicular, axillary, or inguinal regions Skin: no rash or abnormalities Neurologic: Normal gait and speech, no tremor  Psychiatric: Alert and oriented, appropriate affect.   Wet prep: negative    Assessment:    Normal gyn exam  Right adnexa pain   Plan:    mammogram pap smear return annually or prn STD screening: done Pelvic ultrasound Refer to Dr Loreta Ave for [facility] screen   Silverio Lay MD

## 2012-08-07 LAB — HSV 1 ANTIBODY, IGG: HSV 1 Glycoprotein G Ab, IgG: 8.55 IV — ABNORMAL HIGH

## 2012-08-10 LAB — PAP IG, CT-NG, RFX HPV ASCU

## 2012-08-10 NOTE — Progress Notes (Signed)
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 Send info booklet ______

## 2012-08-19 ENCOUNTER — Other Ambulatory Visit: Payer: 59

## 2012-08-19 ENCOUNTER — Encounter: Payer: 59 | Admitting: Obstetrics and Gynecology

## 2012-09-03 ENCOUNTER — Encounter: Payer: 59 | Admitting: Obstetrics and Gynecology

## 2012-09-03 ENCOUNTER — Other Ambulatory Visit: Payer: 59

## 2012-11-05 ENCOUNTER — Other Ambulatory Visit: Payer: Self-pay | Admitting: Obstetrics and Gynecology

## 2012-11-05 DIAGNOSIS — Z1231 Encounter for screening mammogram for malignant neoplasm of breast: Secondary | ICD-10-CM

## 2012-11-09 ENCOUNTER — Ambulatory Visit
Admission: RE | Admit: 2012-11-09 | Discharge: 2012-11-09 | Disposition: A | Discharge status: Home / Self Care | Payer: 59 | Source: Ambulatory Visit | Attending: Obstetrics and Gynecology | Admitting: Obstetrics and Gynecology

## 2012-11-09 ENCOUNTER — Other Ambulatory Visit: Payer: Self-pay | Admitting: Obstetrics and Gynecology

## 2012-11-09 DIAGNOSIS — R928 Other abnormal and inconclusive findings on diagnostic imaging of breast: Secondary | ICD-10-CM

## 2012-11-09 DIAGNOSIS — Z1231 Encounter for screening mammogram for malignant neoplasm of breast: Secondary | ICD-10-CM

## 2012-11-10 NOTE — Progress Notes (Signed)
Quick Note:  The recent mammogram is incomplete / abnormal suggesting a close follow-up / additional images. When is the next appointment to follow-up on this mammogram?  Please document in chart. ______ 

## 2012-11-19 ENCOUNTER — Encounter: Payer: Self-pay | Admitting: Obstetrics and Gynecology

## 2012-11-19 ENCOUNTER — Ambulatory Visit: Payer: 59

## 2012-11-19 ENCOUNTER — Other Ambulatory Visit: Payer: Self-pay | Admitting: Obstetrics and Gynecology

## 2012-11-19 ENCOUNTER — Ambulatory Visit: Payer: 59 | Admitting: Obstetrics and Gynecology

## 2012-11-19 VITALS — BP 134/70 | Temp 98.7°F | Ht 66.0 in | Wt 203.0 lb

## 2012-11-19 DIAGNOSIS — R1031 Right lower quadrant pain: Secondary | ICD-10-CM

## 2012-11-19 DIAGNOSIS — D259 Leiomyoma of uterus, unspecified: Secondary | ICD-10-CM

## 2012-11-19 DIAGNOSIS — N390 Urinary tract infection, site not specified: Secondary | ICD-10-CM

## 2012-11-19 LAB — POCT URINALYSIS DIPSTICK
Bilirubin, UA: NEGATIVE
Glucose, UA: NEGATIVE
Ketones, UA: NEGATIVE
Leukocytes, UA: NEGATIVE
Nitrite, UA: NEGATIVE
pH, UA: 5

## 2012-11-19 NOTE — Progress Notes (Signed)
Subjective:    [name] is a 52 y.o. female, G2P1011, who presents for Gyn ultrasound because of RLQ pain  on previous pelvic exam. Pt states having no pain now while sitting. Pt states she has recalls labial itching x 1-2 month at night   The following portions of the patient's history were reviewed and updated as appropriate: allergies, current medications, past family history.  Objective:    LMP 01/04/2011    Weight:  Wt Readings from Last 1 Encounters:  08/06/12 200 lb (90.719 kg)          BMI: There is no height or weight on file to calculate BMI.  ULTRASOUND: Uterus Surgically Absent    Adnexa Normal    Free fluid: no    Other findings:  Normal ovaries,there is a cervical mass = 1.7 x 1.9 x 1.6 cm appearance suggest a fibroid. No color doppler flow is seen in this area.    Assessment:    normal ultrasound - reassurance    Plan:    Urine Culture Sent for U/A with tr blood and chronic frequency AEX due 07/2013  Silverio Lay MD

## 2012-11-20 ENCOUNTER — Ambulatory Visit
Admission: RE | Admit: 2012-11-20 | Discharge: 2012-11-20 | Disposition: A | Discharge status: Home / Self Care | Payer: 59 | Source: Ambulatory Visit | Attending: Obstetrics and Gynecology | Admitting: Obstetrics and Gynecology

## 2012-11-20 DIAGNOSIS — R928 Other abnormal and inconclusive findings on diagnostic imaging of breast: Secondary | ICD-10-CM

## 2012-11-21 LAB — URINE CULTURE: Colony Count: 25000

## 2014-01-18 ENCOUNTER — Encounter: Payer: Self-pay | Admitting: Neurology

## 2014-01-18 ENCOUNTER — Ambulatory Visit: Payer: 59 | Admitting: Neurology

## 2014-01-25 ENCOUNTER — Ambulatory Visit: Payer: 59 | Admitting: Neurology

## 2014-03-11 ENCOUNTER — Ambulatory Visit: Payer: 59 | Admitting: Neurology

## 2014-03-15 ENCOUNTER — Encounter: Payer: Self-pay | Admitting: Neurology

## 2014-03-16 ENCOUNTER — Telehealth: Payer: Self-pay | Admitting: Neurology

## 2014-03-16 NOTE — Telephone Encounter (Signed)
Per Dr Posey Pronto we will not see patient she has no showed two appts and canceled one all new patient appts referring dr office was notify of this [name]

## 2014-06-20 ENCOUNTER — Ambulatory Visit (INDEPENDENT_AMBULATORY_CARE_PROVIDER_SITE_OTHER): Payer: BC Managed Care – PPO | Admitting: Emergency Medicine

## 2014-06-20 VITALS — BP 152/82 | HR 84 | Temp 98.1°F | Resp 18 | Ht 66.0 in | Wt 206.0 lb

## 2014-06-20 DIAGNOSIS — I10 Essential (primary) hypertension: Secondary | ICD-10-CM

## 2014-06-20 DIAGNOSIS — Z1211 Encounter for screening for malignant neoplasm of colon: Secondary | ICD-10-CM

## 2014-06-20 MED ORDER — LISINOPRIL-HYDROCHLOROTHIAZIDE 20-25 MG PO TABS: 1.0000 | ORAL_TABLET | Freq: Every day | ORAL | Status: DC

## 2014-06-20 NOTE — Progress Notes (Signed)
[facility] [address], [place] [address] [phone]  Date:  06/20/2014   Name:  [name]   DOB:  [birth year]   MRN:  [id number]  PCP:  Alwyn Pea, MD    Chief Complaint: Medication Refill   History of Present Illness:  [name] is a 53 y.o. very pleasant female patient who presents with the following:  History of hypertension and has run out of medication.  Has no complaint of chest pain or shortness of breath.   Has never had colonoscopy and has not had lipids in long time.   Works full Water engineer.  Non smoker. No improvement with over the counter medications or other home remedies.  Denies other complaint or health concern today.   Patient Active Problem List   Diagnosis Date Noted  . Leiomyoma of uterus, unspecified 04/25/2011  . Hypertension, essential 04/25/2011    Past Medical History  Diagnosis Date  . Enlarged heart     no problems per patient  . Shortness of breath     with exertion  . Hypertension     on meds  . Blood transfusion     09/2010  . GERD (gastroesophageal reflux disease)     diet -  otc med prn  . Headache(784.0)     tx with otc ibuprofen  . Anemia     on med - caltrate  . Myasthenia gravis     rare - no current problems - if flares just affects eyes -see double  . Pre-diabetes     Past Surgical History  Procedure Laterality Date  . Appendectomy      age 69 yrs  . Cesarean section      2000 - FTP  . Myomectomy      abd myomectomy 1990's  . Robotic supracervical hysterectomy  04/2011  . Wisdom tooth extraction      History  Substance Use Topics  . Smoking status: Never Smoker   . Smokeless tobacco: Never Used  . Alcohol Use: Yes     Comment: occasionally - 1-2 month - wine    Family History  Problem Relation Age of Onset  . Anesthesia problems Neg Hx   . Hypotension Neg Hx   . Malignant hyperthermia Neg Hx   . Pseudochol deficiency Neg Hx   . Heart failure Father 69  . Lung  cancer Brother 18  . Cancer Daughter 12    lumphomia   . Hypertension Mother   . Diabetes Mother   . Thyroid nodules Mother     Allergies  Allergen Reactions  . Codeine Nausea And Vomiting and Other (See Comments)    Dizziness,  rooom spends    Medication list has been reviewed and updated.  Current Outpatient Prescriptions on File Prior to Visit  Medication Sig Dispense Refill  . lisinopril-hydrochlorothiazide (PRINZIDE,ZESTORETIC) 20-25 MG per tablet Take 1 tablet by mouth daily.        Marland Kitchen pyridostigmine (MESTINON) 180 MG CR tablet Take 180 mg by mouth daily.        Marland Kitchen acetaminophen (TYLENOL) 325 MG tablet Take 650 mg by mouth every 6 (six) hours as needed.      . Calcium Citrate-Vitamin D (CALCIUM CITRATE + D PO) Take 2 tablets by mouth daily.        Marland Kitchen estradiol (ESTRACE) 1 MG tablet Take 1 tablet (1 mg total) by mouth daily.  90 tablet  4  . ibuprofen (ADVIL,MOTRIN) 800  MG tablet Take 800-1,600 mg by mouth every 8 (eight) hours as needed. PATIENT TAKES FOR PAIN       . multivitamin (THERAGRAN) per tablet Take 1 tablet by mouth daily.         No current facility-administered medications on file prior to visit.    Review of Systems:  As per HPI, otherwise negative.    Physical Examination: Filed Vitals:   06/20/14 1524  BP: 152/82  Pulse: 84  Temp: 98.1 F (36.7 C)  Resp: 18   Filed Vitals:   06/20/14 1524  Height: 5\' 6"  (1.676 m)  Weight: 206 lb (93.441 kg)   Body mass index is 33.27 kg/(m^2). Ideal Body Weight: Weight in (lb) to have BMI = 25: 154.6  GEN: WDWN, NAD, Non-toxic, A & O x 3 HEENT: Atraumatic, Normocephalic. Neck supple. No masses, No LAD. Ears and Nose: No external deformity. CV: RRR, No M/G/R. No JVD. No thrill. No extra heart sounds. PULM: CTA B, no wheezes, crackles, rhonchi. No retractions. No resp. distress. No accessory muscle use. ABD: S, NT, ND, +BS. No rebound. No HSM. EXTR: No c/c/e NEURO Normal gait.  PSYCH: Normally interactive.  Conversant. Not depressed or anxious appearing.  Calm demeanor.   Assessment and Plan: Hypertension Refill Labs pending  Signed,  Ellison Carwin, MD

## 2014-06-20 NOTE — Patient Instructions (Signed)

## 2015-05-13 ENCOUNTER — Ambulatory Visit (INDEPENDENT_AMBULATORY_CARE_PROVIDER_SITE_OTHER): Payer: 59 | Admitting: Family Medicine

## 2015-05-13 ENCOUNTER — Telehealth: Payer: Self-pay | Admitting: *Deleted

## 2015-05-13 VITALS — BP 177/93 | HR 82 | Temp 98.4°F | Ht 66.0 in | Wt 213.6 lb

## 2015-05-13 DIAGNOSIS — Z1211 Encounter for screening for malignant neoplasm of colon: Secondary | ICD-10-CM | POA: Diagnosis not present

## 2015-05-13 DIAGNOSIS — E669 Obesity, unspecified: Secondary | ICD-10-CM

## 2015-05-13 DIAGNOSIS — Z1231 Encounter for screening mammogram for malignant neoplasm of breast: Secondary | ICD-10-CM

## 2015-05-13 DIAGNOSIS — R7302 Impaired glucose tolerance (oral): Secondary | ICD-10-CM

## 2015-05-13 DIAGNOSIS — Z7251 High risk heterosexual behavior: Secondary | ICD-10-CM

## 2015-05-13 DIAGNOSIS — I10 Essential (primary) hypertension: Secondary | ICD-10-CM

## 2015-05-13 LAB — POCT URINALYSIS DIPSTICK
Bilirubin, UA: NEGATIVE
GLUCOSE UA: NEGATIVE
Ketones, UA: NEGATIVE
Leukocytes, UA: NEGATIVE
NITRITE UA: NEGATIVE
Spec Grav, UA: 1.02
UROBILINOGEN UA: 1
pH, UA: 6.5

## 2015-05-13 LAB — COMPREHENSIVE METABOLIC PANEL
ALK PHOS: 99 U/L (ref 33–130)
ALT: 16 U/L (ref 6–29)
AST: 19 U/L (ref 10–35)
Albumin: 4 g/dL (ref 3.6–5.1)
BUN: 13 mg/dL (ref 7–25)
CO2: 28 mmol/L (ref 20–31)
CREATININE: 0.71 mg/dL (ref 0.50–1.05)
Calcium: 9.6 mg/dL (ref 8.6–10.4)
Chloride: 105 mmol/L (ref 98–110)
Glucose, Bld: 68 mg/dL (ref 65–99)
Potassium: 4.1 mmol/L (ref 3.5–5.3)
Sodium: 140 mmol/L (ref 135–146)
Total Bilirubin: 0.4 mg/dL (ref 0.2–1.2)
Total Protein: 6.9 g/dL (ref 6.1–8.1)

## 2015-05-13 LAB — LIPID PANEL
CHOLESTEROL: 146 mg/dL (ref 125–200)
HDL: 48 mg/dL (ref >=46)
LDL Cholesterol: 85 mg/dL (ref <130)
Total CHOL/HDL Ratio: 3 Ratio (ref <=5.0)
Triglycerides: 63 mg/dL (ref <150)
VLDL: 13 mg/dL (ref <30)

## 2015-05-13 LAB — TSH: TSH: 2.41 u[IU]/mL (ref 0.350–4.500)

## 2015-05-13 MED ORDER — LISINOPRIL-HYDROCHLOROTHIAZIDE 20-25 MG PO TABS: 1.0000 | ORAL_TABLET | Freq: Every day | ORAL | Status: DC

## 2015-05-13 MED ORDER — ESTRADIOL 0.5 MG PO TABS: 0.5000 mg | ORAL_TABLET | Freq: Every day | ORAL | Status: DC

## 2015-05-13 MED ORDER — METOPROLOL SUCCINATE ER 25 MG PO TB24: 25.0000 mg | ORAL_TABLET | Freq: Every day | ORAL | Status: DC

## 2015-05-13 NOTE — Addendum Note (Signed)
Addended by: Wardell Honour on: 05/13/2015 11:30 AM   Modules accepted: Orders

## 2015-05-13 NOTE — Telephone Encounter (Signed)
Dr. Tamala Julian, this pt has called multiple times angry that her Burnett Corrente was not filled.  She states she told the front desk and the triage staff.  However, I asked her if she had discussed this particular medication with Dr. Tamala Julian and she stated she did not need to because the front desk and triage should have verbally told you.  I advised her that the doctors like to discuss all medications with the pt to ensure the medication is still needed and etc.  I advised pt I would call her once I speak with Dr. Tamala Julian.  Pt understood.

## 2015-05-13 NOTE — Patient Instructions (Signed)

## 2015-05-13 NOTE — Progress Notes (Signed)
Subjective:    Patient ID: [name], female    DOB: 02-Dec-1960, 54 y.o.   MRN: [id number]  05/13/2015  Medication Refill   HPI This 54 y.o. female presents for ten month follow-up of hypertension.  Ran out of Lisinopril two weeks ago.  Diagnosed with HTN since 2008.  Home BP running 140-150-160/80.   Previous physician recommended adding B blocker three years ago; pt non-compliant with starting additional medication. Denies CP/pal/SOB/leg swelling.  +HA this morning. Denies dizziness or blurred vision.   HRT therapy: managed by gynecology.  Last visit 11/2012 with gynecology.  Ovaries intact.  Last pap smear 2013.  Mammogram 2014.  Underwent hysterectomy with intact cervix for fibroids.  Last physical  2013; [facility]. Pap smear:  08/06/2012  Fibroids Mammogram:  2014  +cyst versus node L breast; ultrasound negative; no palpable abnormality; follow-up in six months recommended; non-compliant with follow-up. Colonoscopy:  None; refuses. TDAP: as an adult. Declines TDAP today  STD screening: requesting GC/Chlam screening.  Declines HIV, RPR.  No recent sexual activity.  Review of Systems  Constitutional: Negative for fever, chills, diaphoresis and fatigue.  Eyes: Negative for visual disturbance.  Respiratory: Negative for cough and shortness of breath.   Cardiovascular: Negative for chest pain, palpitations and leg swelling.  Gastrointestinal: Negative for nausea, vomiting, abdominal pain, diarrhea, constipation, blood in stool, abdominal distention, anal bleeding and rectal pain.  Endocrine: Negative for cold intolerance, heat intolerance, polydipsia, polyphagia and polyuria.  Neurological: Positive for headaches. Negative for dizziness, tremors, seizures, syncope, facial asymmetry, speech difficulty, weakness, light-headedness and numbness.    Past Medical History  Diagnosis Date  . Enlarged heart     no problems per patient  . Shortness of breath     with exertion  .  Hypertension     on meds  . Blood transfusion     09/2010  . GERD (gastroesophageal reflux disease)     diet -  otc med prn  . Headache(784.0)     tx with otc ibuprofen  . Anemia     on med - caltrate  . Myasthenia gravis     rare - no current problems - if flares just affects eyes -see double  . Pre-diabetes    Past Surgical History  Procedure Laterality Date  . Appendectomy      age 63 yrs  . Cesarean section      2000 - FTP  . Myomectomy      abd myomectomy 1990's  . Robotic supracervical hysterectomy  04/2011  . Wisdom tooth extraction     Allergies  Allergen Reactions  . Codeine Nausea And Vomiting and Other (See Comments)    Dizziness,  rooom spends    History   Social History  . Marital Status: Single    Spouse Name: N/A  . Number of Children: N/A  . Years of Education: N/A   Occupational History  . Not on file.   Social History Main Topics  . Smoking status: Never Smoker   . Smokeless tobacco: Never Used  . Alcohol Use: Yes     Comment: occasionally - 1-2 month - wine  . Drug Use: No  . Sexual Activity: No     Comment: hyst   Other Topics Concern  . Not on file   Social History Narrative   Marital status: single      Children: 1 daugther.      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 Anesthesia problems Neg Hx   . Hypotension Neg Hx   . Malignant hyperthermia Neg Hx   . Pseudochol deficiency Neg Hx   . Heart failure Father 51  . Hypertension Father   . Lung cancer Brother 7  . Heart disease Brother 64    CAD/CHF  . Cancer Daughter 12    lumphomia   . Hypertension Mother   . Diabetes Mother   . Thyroid nodules Mother   . Heart disease Mother 80    CHF  . Cancer Brother     Lung cancer       Objective:    BP 177/93 mmHg  Pulse 82  Temp(Src) 98.4 F (36.9 C)  Ht 5' 6"  (1.676  m)  Wt 213 lb 9.6 oz (96.888 kg)  BMI 34.49 kg/m2  SpO2 98%  LMP 01/04/2011 Physical Exam  Constitutional: She is oriented to person, place, and time. She appears well-developed and well-nourished. No distress.  HENT:  Head: Normocephalic and atraumatic.  Right Ear: External ear normal.  Left Ear: External ear normal.  Nose: Nose normal.  Mouth/Throat: Oropharynx is clear and moist.  Eyes: Conjunctivae and EOM are normal. Pupils are equal, round, and reactive to light.  Neck: Normal range of motion. Neck supple. Carotid bruit is not present. No thyromegaly present.  Cardiovascular: Normal rate, regular rhythm, normal heart sounds and intact distal pulses.  Exam reveals no gallop and no friction rub.   No murmur heard. Pulmonary/Chest: Effort normal and breath sounds normal. She has no wheezes. She has no rales.  Abdominal: Soft. Bowel sounds are normal. She exhibits no distension and no mass. There is no tenderness. There is no rebound and no guarding.  Lymphadenopathy:    She has no cervical adenopathy.  Neurological: She is alert and oriented to person, place, and time. No cranial nerve deficit.  Skin: Skin is warm and dry. No rash noted. She is not diaphoretic. No erythema. No pallor.  Psychiatric: She has a normal mood and affect. Her behavior is normal.   Results for orders placed or performed in visit on 05/13/15  POCT urinalysis dipstick  Result Value Ref Range   Color, UA Yellow    Clarity, UA Clear    Glucose, UA Neg    Bilirubin, UA Neg    Ketones, UA Neg    Spec Grav, UA 1.020    Blood, UA Small    pH, UA 6.5    Protein, UA Trace    Urobilinogen, UA 1.0    Nitrite, UA Neg    Leukocytes, UA Negative Negative       Assessment & Plan:   1. Essential hypertension   2. Obesity   3. Colon cancer screening   4. Encounter for screening mammogram for breast cancer   5. Glucose intolerance (impaired glucose tolerance)   6. High risk sexual behavior     1. HTN:  uncontrolled due to non-compliance with follow-up; refill of Lisinopril-HCTZ provided. Will add Metoprolol ER 74m daily.  Obtain labs and EKG.  Highly recommend exercise and weight loss. 2.  Obesity: recommend weight loss, exercise, low-caloric food choices. 3.  Colon cancer screening: refuses colonoscopy; agrees to home hemosure kit. 4.  Breast cancer screening: abnormal mammogram in 2014; refer for diagnostic mammogram;  pt desires to schedule on own. 5.  Glucose intolerance: noted on chart; obtain glucose, u/a, HgbA1c; recommend weight loss, exercise, low-sugar food choices. 6. STD screening: obtain GC/Chlam per patient request.    Meds ordered this encounter  Medications  . lisinopril-hydrochlorothiazide (PRINZIDE,ZESTORETIC) 20-25 MG per tablet    Sig: Take 1 tablet by mouth daily.    Dispense:  90 tablet    Refill:  1  . metoprolol succinate (TOPROL-XL) 25 MG 24 hr tablet    Sig: Take 1 tablet (25 mg total) by mouth daily.    Dispense:  90 tablet    Refill:  1    Return in about 2 months (around 07/14/2015) for recheck blood pressure.     [name], M.D. [facility] [address] [place], [place]  [id number] [phone] phone [phone] fax

## 2015-05-13 NOTE — Telephone Encounter (Signed)
Pt medication of Estradiol has been called into the pharmacy.  Dr. Tamala Julian only gave her a 3 month supply until she follows up with her doctor like they discussed.

## 2015-05-16 LAB — GC/CHLAMYDIA PROBE AMP
CT Probe RNA: NEGATIVE
GC PROBE AMP APTIMA: NEGATIVE

## 2015-05-16 LAB — HEMOGLOBIN A1C

## 2015-05-22 NOTE — Telephone Encounter (Signed)
Patient is returning a missed call. She would like a call back today. She said to please leave a voicemail if she does not answer since she'll be at work. She is desperate to find out something today

## 2015-05-23 ENCOUNTER — Telehealth: Payer: Self-pay | Admitting: *Deleted

## 2015-05-23 ENCOUNTER — Encounter: Payer: Self-pay | Admitting: Family Medicine

## 2015-05-23 NOTE — Telephone Encounter (Signed)
Pt notified of lab results from 05/13/15

## 2015-06-19 ENCOUNTER — Telehealth: Payer: Self-pay

## 2015-07-14 ENCOUNTER — Ambulatory Visit: Payer: 59 | Admitting: Family Medicine

## 2015-08-20 ENCOUNTER — Other Ambulatory Visit: Payer: Self-pay | Admitting: *Deleted

## 2015-08-20 ENCOUNTER — Telehealth: Payer: Self-pay | Admitting: Obstetrics and Gynecology

## 2015-08-20 NOTE — Telephone Encounter (Signed)
Pt calling asking about lab work.  Advised pt that the only thing I see is repeat urine.  She also needed to come back in for BP recheck.  She also wanted a refill on estradiol.  Advised pt if she could come in today, since she was due back for a BP check that we can check that, her urine and refill estradiol.

## 2015-08-21 ENCOUNTER — Ambulatory Visit (INDEPENDENT_AMBULATORY_CARE_PROVIDER_SITE_OTHER): Payer: 59 | Admitting: Family Medicine

## 2015-08-21 ENCOUNTER — Other Ambulatory Visit: Payer: Self-pay | Admitting: Family Medicine

## 2015-08-21 VITALS — BP 152/90 | HR 85 | Temp 99.1°F | Resp 18 | Ht 66.0 in | Wt 213.0 lb

## 2015-08-21 DIAGNOSIS — R319 Hematuria, unspecified: Secondary | ICD-10-CM

## 2015-08-21 DIAGNOSIS — I10 Essential (primary) hypertension: Secondary | ICD-10-CM

## 2015-08-21 DIAGNOSIS — K061 Gingival enlargement: Secondary | ICD-10-CM | POA: Diagnosis not present

## 2015-08-21 DIAGNOSIS — Z79890 Hormone replacement therapy (postmenopausal): Secondary | ICD-10-CM

## 2015-08-21 DIAGNOSIS — M8930 Hypertrophy of bone, unspecified site: Secondary | ICD-10-CM

## 2015-08-21 LAB — POCT URINALYSIS DIP (MANUAL ENTRY)
Bilirubin, UA: NEGATIVE
Glucose, UA: NEGATIVE
Leukocytes, UA: NEGATIVE
Nitrite, UA: NEGATIVE
Protein Ur, POC: NEGATIVE
SPEC GRAV UA: 1.02
Urobilinogen, UA: 0.2
pH, UA: 7.5

## 2015-08-21 LAB — POC MICROSCOPIC URINALYSIS (UMFC): Mucus: ABSENT

## 2015-08-21 MED ORDER — METOPROLOL SUCCINATE ER 25 MG PO TB24
25.0000 mg | ORAL_TABLET | Freq: Every day | ORAL | Status: DC
Start: 2015-08-21 — End: 2016-02-02

## 2015-08-21 MED ORDER — LISINOPRIL-HYDROCHLOROTHIAZIDE 20-25 MG PO TABS
1.0000 | ORAL_TABLET | Freq: Every day | ORAL | Status: DC
Start: 2015-08-21 — End: 2016-02-02

## 2015-08-21 MED ORDER — ESTRADIOL 0.5 MG PO TABS: 0.5000 mg | ORAL_TABLET | Freq: Every day | ORAL | Status: DC

## 2015-08-21 NOTE — Patient Instructions (Signed)
I will refer you to urology to investigate your blood in the urine.   I will also be in touch with the rest of your labs- we will then take the next step as needed concerning possible Paget disease

## 2015-08-21 NOTE — Progress Notes (Addendum)
[facility] [address], [place] [address] [phone]  Date:  08/21/2015   Name:  [name]   DOB:  1961/08/02   MRN:  [id number]  PCP:  Alwyn Pea, MD    Chief Complaint: Follow-up; Hematuria; lab orders; and Medication Refill   History of Present Illness:  [name] is a 54 y.o. very pleasant female patient who presents with the following:  She needs to recheck on microscopic hematuria that she was noted to have back in July. She has not had any gross hematuria - she has been watching for this. She has never been a smoker History of partial hysterectomy (uterus only) a few years ago due to benign fibroids. She has not noted any vaginal bleeding.    She needs a few RF today  Finally she has been seeing an oral surgeon due to what was thought to be gingival hyperplasia.  However it turned out that her lower gums were overgrowing due to hypertrophy of the bone of her mandible.  She had a bone bx done and was then told that she needed testing for Paget's disease  BP Readings from Last 3 Encounters:  08/21/15 152/90  05/13/15 177/93  06/20/14 152/82     Patient Active Problem List   Diagnosis Date Noted  . Leiomyoma of uterus, unspecified 04/25/2011  . Hypertension, essential 04/25/2011    Past Medical History  Diagnosis Date  . Enlarged heart     no problems per patient  . Shortness of breath     with exertion  . Hypertension     on meds  . Blood transfusion     09/2010  . GERD (gastroesophageal reflux disease)     diet -  otc med prn  . Headache(784.0)     tx with otc ibuprofen  . Anemia     on med - caltrate  . Myasthenia gravis     rare - no current problems - if flares just affects eyes -see double  . Pre-diabetes     Past Surgical History  Procedure Laterality Date  . Appendectomy      age 77 yrs  . Cesarean section      2000 - FTP  . Myomectomy      abd myomectomy 1990's  . Robotic supracervical  hysterectomy  04/2011  . Wisdom tooth extraction      Social History  Substance Use Topics  . Smoking status: Never Smoker   . Smokeless tobacco: Never Used  . Alcohol Use: Yes     Comment: occasionally - 1-2 month - wine    Family History  Problem Relation Age of Onset  . Anesthesia problems Neg Hx   . Hypotension Neg Hx   . Malignant hyperthermia Neg Hx   . Pseudochol deficiency Neg Hx   . Heart failure Father 20  . Hypertension Father   . Lung cancer Brother 29  . Heart disease Brother 49    CAD/CHF  . Cancer Daughter 12    lumphomia   . Hypertension Mother   . Diabetes Mother   . Thyroid nodules Mother   . Heart disease Mother 66    CHF  . Cancer Brother     Lung cancer    Allergies  Allergen Reactions  . Codeine Nausea And Vomiting and Other (See Comments)    Dizziness,  rooom spends    Medication list has been reviewed and updated.  Current Outpatient Prescriptions on  File Prior to Visit  Medication Sig Dispense Refill  . estradiol (ESTRACE) 0.5 MG tablet Take 1 tablet (0.5 mg total) by mouth daily. 90 tablet 0  . lisinopril-hydrochlorothiazide (PRINZIDE,ZESTORETIC) 20-25 MG per tablet Take 1 tablet by mouth daily. 90 tablet 1  . metoprolol succinate (TOPROL-XL) 25 MG 24 hr tablet Take 1 tablet (25 mg total) by mouth daily. 90 tablet 1  . multivitamin (THERAGRAN) per tablet Take 1 tablet by mouth daily.       No current facility-administered medications on file prior to visit.    Review of Systems:  As per HPI- otherwise negative.   Physical Examination: Filed Vitals:   08/21/15 1506  BP: 152/90  Pulse: 85  Temp: 99.1 F (37.3 C)  Resp: 18   Filed Vitals:   08/21/15 1506  Height: 5\' 6"  (1.676 m)  Weight: 213 lb (96.616 kg)   Body mass index is 34.4 kg/(m^2). Ideal Body Weight: Weight in (lb) to have BMI = 25: 154.6  GEN: WDWN, NAD, Non-toxic, A & O x 3, overweight, looks well HEENT: Atraumatic, Normocephalic. Neck supple. No masses, No  LAD. Overgrowth of bone in her lower jaw causing hypertrophied appearance of lower gums Ears and Nose: No external deformity. CV: RRR, No M/G/R. No JVD. No thrill. No extra heart sounds. PULM: CTA B, no wheezes, crackles, rhonchi. No retractions. No resp. distress. No accessory muscle use. ABD: S, NT, ND EXTR: No c/c/e NEURO Normal gait.  PSYCH: Normally interactive. Conversant. Not depressed or anxious appearing.  Calm demeanor.  EN:MMHWKG exam s/p partial hyst, cervix is present and normal, no adnexal masses No source of bleeding seen   Results for orders placed or performed in visit on 08/21/15  POCT Microscopic Urinalysis ([facility])  Result Value Ref Range   WBC,UR,HPF,POC None None WBC/hpf   RBC,UR,HPF,POC Few (A) None RBC/hpf   Bacteria None None, Too numerous to count   Mucus Absent Absent   Epithelial Cells, UR Per Microscopy Few (A) None, Too numerous to count cells/hpf  POCT urinalysis dipstick  Result Value Ref Range   Color, UA yellow yellow   Clarity, UA clear clear   Glucose, UA negative negative   Bilirubin, UA negative negative   Ketones, POC UA trace (5) (A) negative   Spec Grav, UA 1.020    Blood, UA trace-intact (A) negative   pH, UA 7.5    Protein Ur, POC negative negative   Urobilinogen, UA 0.2    Nitrite, UA Negative Negative   Leukocytes, UA Negative Negative    Assessment and Plan: Blood in urine - Plan: POCT Microscopic Urinalysis ([facility]), POCT urinalysis dipstick, Ambulatory referral to Urology, CANCELED: POCT urinalysis dipstick, CANCELED: POCT Microscopic Urinalysis ([facility])  Essential hypertension - Plan: lisinopril-hydrochlorothiazide (PRINZIDE,ZESTORETIC) 20-25 MG tablet, metoprolol succinate (TOPROL-XL) 25 MG 24 hr tablet  Hormone replacement therapy - Plan: estradiol (ESTRACE) 0.5 MG tablet  Gum hyperplasia - Plan: Basic metabolic panel, Alkaline phosphatase  Did refills for her today. Discussed how to dx paget's disease; this is a complex dx  and often involves a bone bx.  It seems that her bone bx has already been done but I do not have this result on hand Will await her labs, and then discuss how to pursue with rheum and/ or endocrinology  Referral to urology for persistent microhematuria.   Her BP is borderline today. Continue metoprolol and lisinopril HCTZ  Signed Lamar Blinks, MD  Called and [facility] on 11/8- her blood work is negative Would suggest  that we refer her to rheumatology for evaluation and to try and find out if she has paget disease.  If she can get a copy of the bone bx culture that would also be helpful.  I will place referral

## 2015-08-22 ENCOUNTER — Encounter: Payer: Self-pay | Admitting: Family Medicine

## 2015-08-22 LAB — BASIC METABOLIC PANEL
BUN: 15 mg/dL (ref 7–25)
CO2: 27 mmol/L (ref 20–31)
CREATININE: 0.96 mg/dL (ref 0.50–1.05)
Calcium: 9.3 mg/dL (ref 8.6–10.4)
Chloride: 101 mmol/L (ref 98–110)
Glucose, Bld: 82 mg/dL (ref 65–99)
Potassium: 4.3 mmol/L (ref 3.5–5.3)
Sodium: 139 mmol/L (ref 135–146)

## 2015-08-22 LAB — ALKALINE PHOSPHATASE: Alkaline Phosphatase: 105 U/L (ref 33–130)

## 2015-08-22 NOTE — Addendum Note (Signed)
Addended by: Lamar Blinks C on: 08/22/2015 08:50 AM   Modules accepted: Orders

## 2016-02-02 ENCOUNTER — Other Ambulatory Visit: Payer: Self-pay

## 2016-02-02 DIAGNOSIS — Z79890 Hormone replacement therapy (postmenopausal): Secondary | ICD-10-CM

## 2016-02-02 DIAGNOSIS — I10 Essential (primary) hypertension: Secondary | ICD-10-CM

## 2016-02-02 MED ORDER — METOPROLOL SUCCINATE ER 25 MG PO TB24: 25.0000 mg | ORAL_TABLET | Freq: Every day | ORAL | Status: DC

## 2016-02-02 MED ORDER — ESTRADIOL 0.5 MG PO TABS: 0.5000 mg | ORAL_TABLET | Freq: Every day | ORAL | Status: DC

## 2016-02-02 MED ORDER — LISINOPRIL-HYDROCHLOROTHIAZIDE 20-25 MG PO TABS: 1.0000 | ORAL_TABLET | Freq: Every day | ORAL | Status: DC

## 2016-02-12 ENCOUNTER — Other Ambulatory Visit: Payer: Self-pay | Admitting: Family Medicine

## 2016-02-13 ENCOUNTER — Other Ambulatory Visit: Payer: Self-pay | Admitting: Emergency Medicine

## 2016-03-03 ENCOUNTER — Other Ambulatory Visit: Payer: Self-pay | Admitting: Family Medicine

## 2016-10-16 ENCOUNTER — Other Ambulatory Visit: Payer: Self-pay | Admitting: Family Medicine

## 2016-10-16 DIAGNOSIS — I10 Essential (primary) hypertension: Secondary | ICD-10-CM

## 2016-11-18 ENCOUNTER — Ambulatory Visit: Payer: Self-pay | Admitting: Family Medicine

## 2018-03-09 ENCOUNTER — Encounter: Payer: Self-pay | Admitting: Family Medicine

## 2018-07-06 DIAGNOSIS — Z1151 Encounter for screening for human papillomavirus (HPV): Secondary | ICD-10-CM | POA: Diagnosis not present

## 2018-07-06 DIAGNOSIS — Z1211 Encounter for screening for malignant neoplasm of colon: Secondary | ICD-10-CM | POA: Diagnosis not present

## 2018-07-06 DIAGNOSIS — Z1231 Encounter for screening mammogram for malignant neoplasm of breast: Secondary | ICD-10-CM | POA: Diagnosis not present

## 2018-07-06 DIAGNOSIS — Z01419 Encounter for gynecological examination (general) (routine) without abnormal findings: Secondary | ICD-10-CM | POA: Diagnosis not present

## 2018-07-06 DIAGNOSIS — Z124 Encounter for screening for malignant neoplasm of cervix: Secondary | ICD-10-CM | POA: Diagnosis not present

## 2018-07-06 DIAGNOSIS — N952 Postmenopausal atrophic vaginitis: Secondary | ICD-10-CM | POA: Diagnosis not present

## 2019-08-13 ENCOUNTER — Emergency Department (HOSPITAL_BASED_OUTPATIENT_CLINIC_OR_DEPARTMENT_OTHER)
Admission: EM | Admit: 2019-08-13 | Discharge: 2019-08-13 | Disposition: A | Discharge status: Home / Self Care | Payer: BC Managed Care – PPO | Attending: Emergency Medicine | Admitting: Emergency Medicine

## 2019-08-13 ENCOUNTER — Emergency Department (HOSPITAL_BASED_OUTPATIENT_CLINIC_OR_DEPARTMENT_OTHER): Payer: BC Managed Care – PPO

## 2019-08-13 ENCOUNTER — Encounter (HOSPITAL_BASED_OUTPATIENT_CLINIC_OR_DEPARTMENT_OTHER): Payer: Self-pay

## 2019-08-13 ENCOUNTER — Other Ambulatory Visit: Payer: Self-pay

## 2019-08-13 DIAGNOSIS — R42 Dizziness and giddiness: Secondary | ICD-10-CM | POA: Diagnosis not present

## 2019-08-13 DIAGNOSIS — Z79899 Other long term (current) drug therapy: Secondary | ICD-10-CM | POA: Diagnosis not present

## 2019-08-13 DIAGNOSIS — R55 Syncope and collapse: Secondary | ICD-10-CM | POA: Diagnosis not present

## 2019-08-13 DIAGNOSIS — I10 Essential (primary) hypertension: Secondary | ICD-10-CM | POA: Insufficient documentation

## 2019-08-13 LAB — CBC WITH DIFFERENTIAL/PLATELET
Abs Immature Granulocytes: 0.01 10*3/uL (ref 0.00–0.07)
Basophils Absolute: 0 10*3/uL (ref 0.0–0.1)
Basophils Relative: 0 %
Eosinophils Absolute: 0 10*3/uL (ref 0.0–0.5)
Eosinophils Relative: 1 %
HCT: 39.6 % (ref 36.0–46.0)
Hemoglobin: 12.1 g/dL (ref 12.0–15.0)
Immature Granulocytes: 0 %
Lymphocytes Relative: 36 %
Lymphs Abs: 1.5 10*3/uL (ref 0.7–4.0)
MCH: 25.6 pg — ABNORMAL LOW (ref 26.0–34.0)
MCHC: 30.6 g/dL (ref 30.0–36.0)
MCV: 83.9 fL (ref 80.0–100.0)
Monocytes Absolute: 0.5 10*3/uL (ref 0.1–1.0)
Monocytes Relative: 12 %
Neutro Abs: 2.2 10*3/uL (ref 1.7–7.7)
Neutrophils Relative %: 51 %
Platelets: 293 10*3/uL (ref 150–400)
RBC: 4.72 MIL/uL (ref 3.87–5.11)
RDW: 14.4 % (ref 11.5–15.5)
WBC: 4.3 10*3/uL (ref 4.0–10.5)
nRBC: 0 % (ref 0.0–0.2)

## 2019-08-13 LAB — BASIC METABOLIC PANEL
Anion gap: 9 (ref 5–15)
BUN: 16 mg/dL (ref 6–20)
CO2: 26 mmol/L (ref 22–32)
Calcium: 9.3 mg/dL (ref 8.9–10.3)
Chloride: 105 mmol/L (ref 98–111)
Creatinine, Ser: 0.94 mg/dL (ref 0.44–1.00)
GFR calc Af Amer: >60 mL/min (ref >60)
GFR calc non Af Amer: >60 mL/min (ref >60)
Glucose, Bld: 101 mg/dL — ABNORMAL HIGH (ref 70–99)
Potassium: 3.3 mmol/L — ABNORMAL LOW (ref 3.5–5.1)
Sodium: 140 mmol/L (ref 135–145)

## 2019-08-13 MED ORDER — POTASSIUM CHLORIDE CRYS ER 20 MEQ PO TBCR
40.0000 meq | EXTENDED_RELEASE_TABLET | Freq: Once | ORAL | Status: AC
  Administered 2019-08-13: 40 meq via ORAL
  Filled 2019-08-13: qty 2

## 2019-08-13 MED ORDER — MECLIZINE HCL 12.5 MG PO TABS: 12.5000 mg | ORAL_TABLET | Freq: Three times a day (TID) | ORAL | 0 refills | Status: DC | PRN

## 2019-08-13 MED ORDER — MECLIZINE HCL 25 MG PO TABS
12.5000 mg | ORAL_TABLET | Freq: Once | ORAL | Status: AC
  Administered 2019-08-13: 12.5 mg via ORAL
  Filled 2019-08-13: qty 1

## 2019-08-13 MED FILL — MECLIZINE 12.5 MG CAPLET: 12.5 | 33 days supply | Qty: 100 | Fill #0

## 2019-08-13 NOTE — ED Notes (Signed)
ED Provider at bedside. 

## 2019-08-13 NOTE — ED Notes (Signed)
Pt on monitor 

## 2019-08-13 NOTE — ED Triage Notes (Signed)
Pt reports that she has been feeling dizzy this week like the room is spinning she reports it does get better with her eyes closed and has had some ringing in her right ear. States that she is supposed to be on medication for Myasthenia gravis but has not been taking it.

## 2019-08-13 NOTE — ED Provider Notes (Signed)
[facility] EMERGENCY DEPARTMENT Provider Note   CSN: [id number] Arrival date & time: 08/13/19  1442     History   Chief Complaint Chief Complaint  Patient presents with  . Dizziness    HPI [name] is a 58 y.o. female.     The history is provided by the patient and medical records. No language interpreter was used.  Dizziness  [name] is a 58 y.o. female who presents to the Emergency Department complaining of dizziness. She presents to the emergency department complaining of dizziness that began three days ago. Dizziness is described as a room spinning sensation. It is worse when she moves and resolved when she closes her eyes. Symptoms have become significantly more severe and more frequent over the last few days. She did have some associated nausea and vomiting yesterday. She did have some mild ringing in her left ear today, now gone. She denies any fevers, chest pain, headache, shortness of breath, abdominal pain. No known COVID 19 exposures. She has a history of myasthenia gravis but has been off of medications for several years. She has a history of hypertension and is compliant with her medications. Denies any tobacco, drug use. Drinks alcohol rarely. No prior similar symptoms. Past Medical History:  Diagnosis Date  . Anemia    on med - caltrate  . Blood transfusion    09/2010  . Enlarged heart    no problems per patient  . GERD (gastroesophageal reflux disease)    diet -  otc med prn  . Headache(784.0)    tx with otc ibuprofen  . Hypertension    on meds  . Myasthenia gravis    rare - no current problems - if flares just affects eyes -see double  . Pre-diabetes   . Shortness of breath    with exertion    Patient Active Problem List   Diagnosis Date Noted  . Leiomyoma of uterus, unspecified 04/25/2011  . Hypertension, essential 04/25/2011    Past Surgical History:  Procedure Laterality Date  . APPENDECTOMY     age 55 yrs  . CESAREAN  SECTION     2000 - FTP  . MYOMECTOMY     abd myomectomy 1990's  . robotic supracervical hysterectomy  04/2011  . WISDOM TOOTH EXTRACTION       OB History    Gravida  2   Para  1   Term  1   Preterm      AB  1   Living  1     SAB  1   TAB      Ectopic      Multiple      Live Births  1            Home Medications    Prior to Admission medications   Medication Sig Start Date End Date Taking? Authorizing Provider  estradiol (ESTRACE) 0.5 MG tablet TAKE 1 TABLET BY MOUTH EVERY DAY 03/06/16   Copland, Gay Filler, MD  ferrous sulfate 325 (65 FE) MG tablet Take by mouth.    [provider]  lisinopril-hydrochlorothiazide (PRINZIDE,ZESTORETIC) 20-25 MG tablet TAKE 1 TABLET BY MOUTH EVERY DAY 03/06/16   Copland, Gay Filler, MD  meclizine (ANTIVERT) 12.5 MG tablet Take 1 tablet (12.5 mg total) by mouth 3 (three) times daily as needed for dizziness. 08/13/19   Quintella Reichert, MD  metoprolol succinate (TOPROL-XL) 25 MG 24 hr tablet TAKE 1 TABLET BY MOUTH EVERY DAY 03/06/16  Copland, Gay Filler, MD  multivitamin [facility]) per tablet Take 1 tablet by mouth daily.      [provider]    Family History Family History  Problem Relation Age of Onset  . Heart failure Father 57  . Hypertension Father   . Lung cancer Brother 63  . Heart disease Brother 55       CAD/CHF  . Cancer Daughter 12       lumphomia   . Hypertension Mother   . Diabetes Mother   . Thyroid nodules Mother   . Heart disease Mother 75       CHF  . Cancer Brother        Lung cancer  . Anesthesia problems Neg Hx   . Hypotension Neg Hx   . Malignant hyperthermia Neg Hx   . Pseudochol deficiency Neg Hx     Social History Social History   Tobacco Use  . Smoking status: Never Smoker  . Smokeless tobacco: Never Used  Substance Use Topics  . Alcohol use: Yes    Comment: occasionally - 1-2 month - wine  . Drug use: No     Allergies   Codeine   Review of Systems Review of  Systems  Neurological: Positive for dizziness.  All other systems reviewed and are negative.    Physical Exam Updated Vital Signs BP (!) 165/68   Pulse 64   Temp 98.8 F (37.1 C) (Oral)   Resp 20   Ht 5\' 6"  (1.676 m)   Wt 96.2 kg   LMP 01/04/2011   SpO2 100%   BMI 34.22 kg/m   Physical Exam Vitals signs and nursing note reviewed.  Constitutional:      Appearance: She is well-developed.  HENT:     Head: Normocephalic and atraumatic.     Right Ear: Tympanic membrane normal.     Left Ear: Tympanic membrane normal.  Eyes:     Extraocular Movements: Extraocular movements intact.     Pupils: Pupils are equal, round, and reactive to light.  Neck:     Musculoskeletal: Neck supple.  Cardiovascular:     Rate and Rhythm: Normal rate and regular rhythm.     Heart sounds: No murmur.  Pulmonary:     Effort: Pulmonary effort is normal. No respiratory distress.     Breath sounds: Normal breath sounds.  Abdominal:     Palpations: Abdomen is soft.     Tenderness: There is no abdominal tenderness. There is no guarding or rebound.  Musculoskeletal:        General: No swelling or tenderness.  Skin:    General: Skin is warm and dry.  Neurological:     Mental Status: She is alert and oriented to person, place, and time.     Comments: No asymmetry of facial movements. Visual fields are grossly intact. No pronator drift. Five out of five strength in all four extremities with sensation to light touch intact in all four extremities. No ataxia on finger to nose bilaterally. Vertigo is reproducible on sitting up in the stretcher.  Psychiatric:        Behavior: Behavior normal.      ED Treatments / Results  Labs (all labs ordered are listed, but only abnormal results are displayed) Labs Reviewed  BASIC METABOLIC PANEL - Abnormal; Notable for the following components:      Result Value   Potassium 3.3 (*)    Glucose, Bld 101 (*)    All other components within normal  limits  CBC WITH  DIFFERENTIAL/PLATELET - Abnormal; Notable for the following components:   MCH 25.6 (*)    All other components within normal limits    EKG EKG Interpretation  Date/Time:  Friday August 13 2019 15:56:27 EDT Ventricular Rate:  72 PR Interval:    QRS Duration: 112 QT Interval:  440 QTC Calculation: 482 R Axis:   -6 Text Interpretation: Sinus rhythm Probable left ventricular hypertrophy Confirmed by Quintella Reichert [phone]) on 08/13/2019 4:25:04 PM   Radiology Ct Head Wo Contrast  Result Date: 08/13/2019 CLINICAL DATA:  Increasing dizziness for the past 3 days. History of vertigo. Near syncopal episode. Vomiting. EXAM: CT HEAD WITHOUT CONTRAST TECHNIQUE: Contiguous axial images were obtained from the base of the skull through the vertex without intravenous contrast. COMPARISON:  07/06/2007 FINDINGS: Brain: Normal appearing cerebral hemispheres and posterior fossa structures. Normal size and position of the ventricles. No intracranial hemorrhage, mass lesion or CT evidence of acute infarction. Vascular: No hyperdense vessel or unexpected calcification. Skull: Normal. Negative for fracture or focal lesion. Sinuses/Orbits: Unremarkable. Other: None. IMPRESSION: Normal examination. Electronically Signed   By: Claudie Revering M.D.   On: 08/13/2019 16:23    Procedures Procedures (including critical care time)  Medications Ordered in ED Medications  meclizine (ANTIVERT) tablet 12.5 mg (12.5 mg Oral Given 08/13/19 1606)  potassium chloride SA (KLOR-CON) CR tablet 40 mEq (40 mEq Oral Given 08/13/19 1644)     Initial Impression / Assessment and Plan / ED Course  I have reviewed the triage vital signs and the nursing notes.  Pertinent labs & imaging results that were available during my care of the patient were reviewed by me and considered in my medical decision making (see chart for details).       Patient here for evaluation of three days of dizziness. She is non-toxic appearing on  evaluation with no focal neurologic deficits. Symptoms are very positional in nature and extinguished quickly. Presentation is not consistent with CVA. Discussed with patient home care for BPPV. Discussed outpatient follow-up and return precautions.  Final Clinical Impressions(s) / ED Diagnoses   Final diagnoses:  Vertigo    ED Discharge Orders         Ordered    meclizine (ANTIVERT) 12.5 MG tablet  3 times daily PRN     08/13/19 1708    Ambulatory referral to Neurology    Comments: An appointment is requested in approximately: 2 weeks   08/13/19 1716           Quintella Reichert, MD 08/14/19 0000

## 2019-08-13 NOTE — ED Notes (Signed)
Patient stated that she has not been taking the Mestinon for Myasthenia gravis for 4 years d/t the side effects that it does on her such as severe abdominal cramping.

## 2019-08-15 ENCOUNTER — Telehealth (HOSPITAL_BASED_OUTPATIENT_CLINIC_OR_DEPARTMENT_OTHER): Payer: Self-pay | Admitting: Emergency Medicine

## 2019-09-13 ENCOUNTER — Inpatient Hospital Stay
Admission: RE | Admit: 2019-09-13 | Discharge: 2019-09-13 | Disposition: A | Discharge status: Home / Self Care | Payer: BC Managed Care – PPO | Source: Ambulatory Visit

## 2019-09-13 DIAGNOSIS — H8110 Benign paroxysmal vertigo, unspecified ear: Secondary | ICD-10-CM | POA: Diagnosis not present

## 2019-09-16 ENCOUNTER — Telehealth: Payer: Self-pay | Admitting: Surgery

## 2019-09-16 ENCOUNTER — Other Ambulatory Visit: Payer: Self-pay | Admitting: Surgery

## 2019-09-16 DIAGNOSIS — I10 Essential (primary) hypertension: Secondary | ICD-10-CM

## 2019-09-16 NOTE — Telephone Encounter (Addendum)
ED CM received call from patient concerning a Neurology referral made from the [facility] ED by Dr. Ralene Bathe 10/30.  Patient states, she was informed by  [facility] that she they were not able to schedule her due to a missed appointment back in 2015.  CM contacted Neurology clinic who confirmed this information. [facility] appointment scheduled for next week 12/8 patient updated with appointment date and time. No further ED CM needs identified.

## 2019-09-21 ENCOUNTER — Ambulatory Visit: Payer: BC Managed Care – PPO | Admitting: Neurology

## 2019-09-21 ENCOUNTER — Other Ambulatory Visit: Payer: Self-pay

## 2019-09-21 ENCOUNTER — Encounter: Payer: Self-pay | Admitting: Neurology

## 2019-09-21 DIAGNOSIS — M545 Low back pain, unspecified: Secondary | ICD-10-CM

## 2019-09-21 DIAGNOSIS — R42 Dizziness and giddiness: Secondary | ICD-10-CM

## 2019-09-21 DIAGNOSIS — G43909 Migraine, unspecified, not intractable, without status migrainosus: Secondary | ICD-10-CM

## 2019-09-21 DIAGNOSIS — H814 Vertigo of central origin: Secondary | ICD-10-CM

## 2019-09-21 DIAGNOSIS — G8929 Other chronic pain: Secondary | ICD-10-CM

## 2019-09-21 HISTORY — DX: Dizziness and giddiness: R42

## 2019-09-21 HISTORY — DX: Migraine, unspecified, not intractable, without status migrainosus: G43.909

## 2019-09-21 HISTORY — DX: Low back pain, unspecified: M54.50

## 2019-09-21 MED ORDER — RIZATRIPTAN BENZOATE 10 MG PO TABS: 10.0000 mg | ORAL_TABLET | Freq: Three times a day (TID) | ORAL | 2 refills | Status: DC | PRN

## 2019-09-21 MED ORDER — TOPIRAMATE 25 MG PO TABS: ORAL_TABLET | ORAL | 3 refills | Status: DC

## 2019-09-21 NOTE — Progress Notes (Signed)
Reason for visit: Vertigo  Referring physician: [place]  [name] is a 58 y.o. female  History of present illness:  [name] is a 58 year old right-handed black female with a history of double vision since the 1990s, she was followed by Dr. Marijean Bravo from neurology previously was told she had ocular myasthenia gravis.  She has been off of all disease modifying agents for quite a number of years, she was last seen through a neurologist in 2008.  She has a history of intermittent headaches, she may have 1 or 2 bad headaches a month but she has several more minor headaches a week.  Her headaches may be brought on by bright lights.  The patient over the last year has noted some problems with vertigo associated with spinning sensation that may come and go, she is not clearly related to her headache.  The patient may have vertigo that may last up to 5 minutes, she will have to remain still and let the vertigo pass.  She may have vertigo getting up out of bed in the morning, she had this on 13 August 2019, this was associated with significant nausea and vomiting.  The patient went to the emergency room and had a CT scan of the brain that was unremarkable.  She reports some numbness in the hands and feet, she has had carpal tunnel surgery on the right, and was told she had it on the left but did not have surgery.  She reports no weakness to the extremities.  She reports significant low back pain with standing, she cannot stand for more than 5 minutes at a time.  She denies problems controlling the bowels or the bladder, she does have some gait instability when she feels dizzy, she may fall on occasion.  She is sent to this office for an evaluation.  Past Medical History:  Diagnosis Date  . Anemia    on med - caltrate  . Blood transfusion    09/2010  . Enlarged heart    no problems per patient  . GERD (gastroesophageal reflux disease)    diet -  otc med prn  . Headache(784.0)    tx with otc  ibuprofen  . Hypertension    on meds  . Myasthenia gravis    rare - no current problems - if flares just affects eyes -see double  . Pre-diabetes   . Shortness of breath    with exertion    Past Surgical History:  Procedure Laterality Date  . APPENDECTOMY     age 38 yrs  . CESAREAN SECTION     2000 - FTP  . MYOMECTOMY     abd myomectomy 1990's  . robotic supracervical hysterectomy  04/2011  . WISDOM TOOTH EXTRACTION      Family History  Problem Relation Age of Onset  . Heart failure Father 21  . Hypertension Father   . Lung cancer Brother 44  . Heart disease Brother 34       CAD/CHF  . Cancer Daughter 12       lumphomia   . Hypertension Mother   . Diabetes Mother   . Thyroid nodules Mother   . Heart disease Mother 69       CHF  . Cancer Brother        Lung cancer  . Anesthesia problems Neg Hx   . Hypotension Neg Hx   . Malignant hyperthermia Neg Hx   . Pseudochol deficiency Neg Hx  Social history:  reports that she has never smoked. She has never used smokeless tobacco. She reports current alcohol use. She reports that she does not use drugs.  Medications:  Prior to Admission medications   Medication Sig Start Date End Date Taking? Authorizing Provider  estradiol (ESTRACE) 0.5 MG tablet TAKE 1 TABLET BY MOUTH EVERY DAY 03/06/16   Copland, Gay Filler, MD  ferrous sulfate 325 (65 FE) MG tablet Take by mouth.    [provider]  lisinopril-hydrochlorothiazide (PRINZIDE,ZESTORETIC) 20-25 MG tablet TAKE 1 TABLET BY MOUTH EVERY DAY 03/06/16   Copland, Gay Filler, MD  meclizine (ANTIVERT) 12.5 MG tablet Take 1 tablet (12.5 mg total) by mouth 3 (three) times daily as needed for dizziness. 08/13/19   Quintella Reichert, MD  metoprolol succinate (TOPROL-XL) 25 MG 24 hr tablet TAKE 1 TABLET BY MOUTH EVERY DAY 03/06/16   Copland, Gay Filler, MD  multivitamin [facility]) per tablet Take 1 tablet by mouth daily.      [provider]      Allergies  Allergen  Reactions  . Codeine Nausea And Vomiting and Other (See Comments)    Dizziness,  rooom spends  . Etodolac Nausea Only  . Hydrocodone-Acetaminophen Nausea And Vomiting    ROS:  Out of a complete 14 system review of symptoms, the patient complains only of the following symptoms, and all other reviewed systems are negative.  Headache Double vision Low back pain Vertigo  Blood pressure (!) 153/75, pulse 73, temperature 97.6 F (36.4 C), temperature source Temporal, height 5\' 6"  (1.676 m), weight 225 lb (102.1 kg), last menstrual period 01/04/2011.  Physical Exam  General: The patient is alert and cooperative at the time of the examination.  The patient is moderately to markedly obese.  Eyes: Pupils are equal, round, and reactive to light. Discs are flat bilaterally.  Good venous pulsations are seen bilaterally.  Ears: Tympanic membranes are clear bilaterally.  Neck: The neck is supple, no carotid bruits are noted.  Respiratory: The respiratory examination is clear.  Cardiovascular: The cardiovascular examination reveals a regular rate and rhythm, no obvious murmurs or rubs are noted.  Skin: Extremities are without significant edema.  Neurologic Exam  Mental status: The patient is alert and oriented x 3 at the time of the examination. The patient has apparent normal recent and remote memory, with an apparently normal attention span and concentration ability.  Cranial nerves: Facial symmetry is present. There is good sensation of the face to pinprick and soft touch bilaterally. The strength of the facial muscles and the muscles to head turning and shoulder shrug are normal bilaterally. Speech is well enunciated, no aphasia or dysarthria is noted. Extraocular movements are full. Visual fields are full. The tongue is midline, and the patient has symmetric elevation of the soft palate. No obvious hearing deficits are noted.  Motor: The motor testing reveals 5 over 5 strength of all 4  extremities. Good symmetric motor tone is noted throughout.  Sensory: Sensory testing is intact to pinprick, soft touch, vibration sensation, and position sense on all 4 extremities. No evidence of extinction is noted.  Coordination: Cerebellar testing reveals good finger-nose-finger and heel-to-shin bilaterally. The Nyan-barrany procedure did not result in any nystagmus, the patient did report some dizziness, however.  Gait and station: Gait is normal. Tandem gait is normal. Romberg is negative. No drift is seen.  Reflexes: Deep tendon reflexes are symmetric and normal bilaterally. Toes are downgoing bilaterally.   CT head 08/13/19:  IMPRESSION: Normal  examination.  * CT scan images were reviewed online. I agree with the written report.    Assessment/Plan:  1.  History of migraine headache  2.  Vertigo, intermittent  3.  Chronic low back pain  The patient does not clearly have a positional component to the vertigo, she does have migraine headache and could potentially have vertigo associated with this.  The patient will be set up for MRI of the brain with and without gadolinium enhancement.  She will be started on Topamax, she claims that she has been on this medication previously but does not remember her response to the medication.  The patient will undergo an x-[place]beau of the low back.  She claims that her low back pain has gotten significant over the last several months.  She does not have a primary care physician at this time.  She will follow-up here in 3 months.  A prescription was sent in for Maxalt for the headache.  Jill Alexanders MD 09/21/2019 9:50 AM  [facility] [address] [place], [place] [address]  Phone [phone] Fax [phone]

## 2019-09-28 ENCOUNTER — Telehealth: Payer: Self-pay

## 2019-09-28 DIAGNOSIS — Z0289 Encounter for other administrative examinations: Secondary | ICD-10-CM

## 2019-09-28 NOTE — Telephone Encounter (Signed)
[name], Since the creatinine was normal at the end of October, she does not need additional blood work.

## 2019-09-28 NOTE — Telephone Encounter (Signed)
Dr. Felecia Shelling. Could you review and advised if pt needs creatine checked prior to MRI? In epic last Creatine was checked on 08/13/2019 and result was within a normal range along with normal GFR.

## 2019-09-28 NOTE — Telephone Encounter (Signed)
Patient scheduled her MRI but states that she does have high blood pressure and she stated that she has not had any labs done prior to this imaging, if she needs creatinine labs done will you please order and let her know?

## 2019-09-29 NOTE — Telephone Encounter (Signed)
I reached out to the pt and advised of MD's response. Pt was agreeable. Pt is scheduled for 10/06/2019 @ [facility] mobile unit for MRI.

## 2019-10-06 ENCOUNTER — Ambulatory Visit: Payer: BC Managed Care – PPO

## 2019-10-06 ENCOUNTER — Other Ambulatory Visit: Payer: Self-pay

## 2019-10-06 ENCOUNTER — Telehealth: Payer: Self-pay | Admitting: Neurology

## 2019-10-06 DIAGNOSIS — H814 Vertigo of central origin: Secondary | ICD-10-CM

## 2019-10-06 MED ORDER — ALPRAZOLAM 0.5 MG PO TABS: ORAL_TABLET | ORAL | 0 refills | Status: DC

## 2019-10-06 NOTE — Telephone Encounter (Signed)
This patient apparently is claustrophobic, we will send in a prescription for alprazolam.  The patient is inquiring why she cannot have MRI of the lumbar spine as well, generally we will start by getting an x-Quinter which has been ordered, conservative measures for 6 weeks, if no better then MRI of the lumbar spine can be done.

## 2019-12-28 ENCOUNTER — Ambulatory Visit: Payer: Self-pay | Admitting: Neurology

## 2020-03-17 ENCOUNTER — Telehealth: Payer: Self-pay | Admitting: *Deleted

## 2020-03-17 NOTE — Telephone Encounter (Signed)
TOC CM received call from pt and states she was seeing a Neurologist but has not signed up for a PCP. Assisted pt with getting an appt at [facility] on [address] for 03/30/2020 at 2:00 pm with Dr Ali Lowe. Pt states she plans to schedule an appt to see her Neurologist Dr Jannifer Franklin soon. She was suppose to have MRI, but could not complete due to extreme heat in MRI room. She plans to discuss with getting a new location to complete MRI. Pt thankful for the assistance.   [place], [place] [facility] TOC CM [phone]

## 2020-03-30 ENCOUNTER — Other Ambulatory Visit: Payer: Self-pay

## 2020-03-30 ENCOUNTER — Ambulatory Visit: Payer: BC Managed Care – PPO | Admitting: Family Medicine

## 2020-03-30 ENCOUNTER — Encounter: Payer: Self-pay | Admitting: Family Medicine

## 2020-03-30 VITALS — BP 142/78 | HR 70 | Temp 97.0°F | Ht 66.0 in | Wt 225.6 lb

## 2020-03-30 DIAGNOSIS — M545 Low back pain: Secondary | ICD-10-CM

## 2020-03-30 DIAGNOSIS — D649 Anemia, unspecified: Secondary | ICD-10-CM | POA: Diagnosis not present

## 2020-03-30 DIAGNOSIS — G43909 Migraine, unspecified, not intractable, without status migrainosus: Secondary | ICD-10-CM

## 2020-03-30 DIAGNOSIS — E611 Iron deficiency: Secondary | ICD-10-CM

## 2020-03-30 DIAGNOSIS — Z0000 Encounter for general adult medical examination without abnormal findings: Secondary | ICD-10-CM

## 2020-03-30 DIAGNOSIS — R5383 Other fatigue: Secondary | ICD-10-CM

## 2020-03-30 DIAGNOSIS — G7000 Myasthenia gravis without (acute) exacerbation: Secondary | ICD-10-CM

## 2020-03-30 DIAGNOSIS — Z1159 Encounter for screening for other viral diseases: Secondary | ICD-10-CM | POA: Diagnosis not present

## 2020-03-30 DIAGNOSIS — E559 Vitamin D deficiency, unspecified: Secondary | ICD-10-CM

## 2020-03-30 DIAGNOSIS — E669 Obesity, unspecified: Secondary | ICD-10-CM

## 2020-03-30 DIAGNOSIS — G8929 Other chronic pain: Secondary | ICD-10-CM

## 2020-03-30 DIAGNOSIS — I10 Essential (primary) hypertension: Secondary | ICD-10-CM | POA: Diagnosis not present

## 2020-03-30 NOTE — Progress Notes (Addendum)
New Patient Office Visit  Subjective:  Patient ID: [name], female    DOB: 1960/11/04  Age: 59 y.o. MRN: [id number]  CC:  Chief Complaint  Patient presents with  . Establish Care    New patient     HPI [name] presents for establishment of care with multiple issues and concerns as outlined below.  Would like to start working on health maintenance.  Does not receive regular dental care.  GYN care is through her GYN doctor, Dr. Posey Pronto in [facility].  She is planning on making an appointment for a Pap smear.  Has never had a colonoscopy.  History of hypertension treated with Zestoretic, Toprol.  History of chronic lower back pain and carpal tunnel syndrome.  History of myasthenia gravis, intermittent diplopia and ongoing labyrinthitis.  History of migraine headaches.  History of overwhelming fatigue.  There is a family history of diabetes.  Past Medical History:  Diagnosis Date  . Anemia    on med - caltrate  . Blood transfusion    09/2010  . Chronic low back pain 09/21/2019  . Enlarged heart    no problems per patient  . GERD (gastroesophageal reflux disease)    diet -  otc med prn  . Headache(784.0)    tx with otc ibuprofen  . Hypertension    on meds  . Migraine headache 09/21/2019  . Myasthenia gravis    rare - no current problems - if flares just affects eyes -see double  . Pre-diabetes   . Shortness of breath    with exertion  . Vertigo 09/21/2019    Past Surgical History:  Procedure Laterality Date  . APPENDECTOMY     age 4 yrs  . CESAREAN SECTION     2000 - FTP  . MYOMECTOMY     abd myomectomy 1990's  . robotic supracervical hysterectomy  04/2011  . WISDOM TOOTH EXTRACTION      Family History  Problem Relation Age of Onset  . Heart failure Father 60  . Hypertension Father   . Lung cancer Brother 24  . Heart disease Brother 70       CAD/CHF  . Cancer Daughter 12       lumphomia   . Hypertension Mother   . Diabetes Mother   . Thyroid nodules  Mother   . Heart disease Mother 22       CHF  . Cancer Brother        Lung cancer  . Anesthesia problems Neg Hx   . Hypotension Neg Hx   . Malignant hyperthermia Neg Hx   . Pseudochol deficiency Neg Hx     Social History   Socioeconomic History  . Marital status: Single    Spouse name: Not on file  . Number of children: Not on file  . Years of education: Not on file  . Highest education level: Not on file  Occupational History  . Not on file  Tobacco Use  . Smoking status: Never Smoker  . Smokeless tobacco: Never Used  Vaping Use  . Vaping Use: Never used  Substance and Sexual Activity  . Alcohol use: Yes    Comment: occasionally - 1-2 month - wine  . Drug use: No  . Sexual activity: Never    Birth control/protection: Surgical    Comment: hyst  Other Topics Concern  . Not on file  Social History Narrative   Marital status: single      Children: 1  daugther.      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 Difficulty of Paying Living Expenses:   Food Insecurity:   . Worried About Charity fundraiser in the Last Year:   . Arboriculturist in the Last Year:   Transportation Needs:   . Film/video editor (Medical):   Marland Kitchen Lack of Transportation (Non-Medical):   Physical Activity:   . Days of Exercise per Week:   . Minutes of Exercise per Session:   Stress:   . Feeling of Stress :   Social Connections:   . Frequency of Communication with Friends and Family:   . Frequency of Social Gatherings with Friends and Family:   . Attends Religious Services:   . Active Member of Clubs or Organizations:   . Attends Archivist Meetings:   Marland Kitchen Marital Status:   Intimate Partner Violence:   . Fear of Current or Ex-Partner:   . Emotionally Abused:   Marland Kitchen Physically Abused:   . Sexually  Abused:     ROS Review of Systems  Constitutional: Negative for diaphoresis, fatigue, fever and unexpected weight change.  HENT: Negative.   Eyes: Positive for visual disturbance. Negative for photophobia.  Respiratory: Negative.   Cardiovascular: Negative.   Gastrointestinal: Negative.   Endocrine: Negative for polyphagia and polyuria.  Genitourinary: Negative.   Musculoskeletal: Positive for back pain.  Skin: Negative for pallor and rash.  Neurological: Positive for dizziness, numbness and headaches. Negative for speech difficulty.  Hematological: Does not bruise/bleed easily.   Depression screen [facility] 2/9 03/30/2020 03/30/2020 08/21/2015  Decreased Interest 3 0 0  Down, Depressed, Hopeless 0 0 0  PHQ - 2 Score 3 0 0  Altered sleeping 1 - -  Tired, decreased energy 3 - -  Change in appetite 1 - -  Feeling bad or failure about yourself  0 - -  Trouble concentrating 0 - -  Moving slowly or fidgety/restless 0 - -  Suicidal thoughts 0 - -  PHQ-9 Score 8 - -    Objective:   Today's Vitals: BP (!) 142/78   Pulse 70   Temp (!) 97 F (36.1 C) (Tympanic)   Ht 5\' 6"  (1.676 m)   Wt 225 lb 9.6 oz (102.3 kg)   LMP 01/04/2011   SpO2 96%   BMI 36.41 kg/m   Physical Exam Vitals and nursing note reviewed.  Constitutional:      General: She is not in acute distress.    Appearance: Normal appearance. She is obese. She is not ill-appearing, toxic-appearing or diaphoretic.  HENT:     Head: Normocephalic and atraumatic.     Right Ear: Tympanic membrane, ear canal and external ear normal.     Left Ear: Tympanic membrane, ear canal and external ear normal.  Eyes:     General: No scleral icterus.       Right eye: No discharge.        Left eye: No discharge.     Extraocular Movements: Extraocular movements intact.     Conjunctiva/sclera: Conjunctivae normal.     Pupils: Pupils are equal, round, and reactive to light.  Cardiovascular:     Rate and Rhythm: Normal rate and  regular  rhythm.  Pulmonary:     Effort: Pulmonary effort is normal.     Breath sounds: Normal breath sounds.  Abdominal:     General: Bowel sounds are normal.  Musculoskeletal:     Cervical back: No rigidity or tenderness.     Right lower leg: No edema.     Left lower leg: No edema.  Lymphadenopathy:     Cervical: No cervical adenopathy.  Neurological:     Mental Status: She is alert and oriented to person, place, and time.     Comments: Vertical nystagmus?   Psychiatric:        Mood and Affect: Mood normal.        Behavior: Behavior normal.     Assessment & Plan:   Problem List Items Addressed This Visit      Cardiovascular and Mediastinum   Essential hypertension - Primary   Relevant Orders   Comprehensive metabolic panel (Completed)   CBC (Completed)   Urinalysis, Routine w reflex microscopic (Completed)   Migraine headache   Relevant Orders   Ambulatory referral to Neurology     Nervous and Auditory   Myasthenia gravis ([place])   Relevant Orders   Ambulatory referral to Neurology     Other   Chronic low back pain   Relevant Orders   Ambulatory referral to Sports Medicine   Anemia   Relevant Orders   Iron, TIBC and Ferritin Panel (Completed)   B12 and Folate Panel (Completed)   Healthcare maintenance   Relevant Orders   Comprehensive metabolic panel (Completed)   CBC (Completed)   Hemoglobin A1c (Completed)   LDL cholesterol, direct (Completed)   MM Digital Screening   Ambulatory referral to Gastroenterology   Fatigue   Relevant Orders   Comprehensive metabolic panel (Completed)   CBC (Completed)   HIV Antibody (routine testing w rflx) (Completed)   Hemoglobin A1c (Completed)   Hepatitis C antibody (Completed)   TSH (Completed)   VITAMIN D 25 Hydroxy (Vit-D Deficiency, Fractures) (Completed)   Obesity (BMI 30-39.9)   Relevant Orders   Amb ref to Medical Nutrition Therapy-MNT   Iron deficiency   Vitamin D deficiency   Relevant Medications   Vitamin D,  Ergocalciferol, (DRISDOL) 1.25 MG (50000 UNIT) CAPS capsule      Outpatient Encounter Medications as of 03/30/2020  Medication Sig  . lisinopril-hydrochlorothiazide (PRINZIDE,ZESTORETIC) 20-25 MG tablet TAKE 1 TABLET BY MOUTH EVERY DAY  . metoprolol succinate (TOPROL-XL) 25 MG 24 hr tablet TAKE 1 TABLET BY MOUTH EVERY DAY  . multivitamin (THERAGRAN) per tablet Take 1 tablet by mouth daily.    Marland Kitchen topiramate (TOPAMAX) 25 MG tablet Take one tablet at night for one week, then take 2 tablets at night for one week, then take 3 tablets at night.  . ALPRAZolam (XANAX) 0.5 MG tablet Take 2 tablets approximately 45 minutes prior to the MRI study, take a third tablet if needed. (Patient not taking: Reported on 03/30/2020)  . ferrous sulfate 325 (65 FE) MG tablet Take by mouth. (Patient not taking: Reported on 03/30/2020)  . meclizine (ANTIVERT) 12.5 MG tablet Take 1 tablet (12.5 mg total) by mouth 3 (three) times daily as needed for dizziness. (Patient not taking: Reported on 03/30/2020)  . rizatriptan (MAXALT) 10 MG tablet Take 1 tablet (10 mg total) by mouth 3 (three) times daily as needed for migraine. (Patient not taking: Reported on 03/30/2020)  . Vitamin D, Ergocalciferol, (DRISDOL) 1.25 MG (50000 UNIT) CAPS capsule Take 1 capsule (50,000  Units total) by mouth every 7 (seven) days.   No facility-administered encounter medications on file as of 03/30/2020.    Follow-up: Return in about 3 months (around 06/30/2020), or check and record blood pressures.. Given information on BMI and obesity.  Patient agrees to go for nutritional counseling.  Believe that at least some of her problems are related to this issue.  Libby Maw, MD

## 2020-03-30 NOTE — Patient Instructions (Signed)
BMI for Adults What is BMI? Body mass index (BMI) is a number that is calculated from a person's weight and height. BMI can help estimate how much of a person's weight is composed of fat. BMI does not measure body fat directly. Rather, it is an alternative to procedures that directly measure body fat, which can be difficult and expensive. BMI can help identify people who may be at higher risk for certain medical problems. What are BMI measurements used for? BMI is used as a screening tool to identify possible weight problems. It helps determine whether a person is obese, overweight, a healthy weight, or underweight. BMI is useful for:  Identifying a weight problem that may be related to a medical condition or may increase the risk for medical problems.  Promoting changes, such as changes in diet and exercise, to help reach a healthy weight. BMI screening can be repeated to see if these changes are working. How is BMI calculated? BMI involves measuring your weight in relation to your height. Both height and weight are measured, and the BMI is calculated from those numbers. This can be done either in [place] ([place] or metric measurements. Note that charts and online BMI calculators are available to help you find your BMI quickly and easily without having to do these calculations yourself. To calculate your BMI in [place] ([place] measurements:  1. Measure your weight in pounds (lb). 2. Multiply the number of pounds by 703. ? For example, for a person who weighs 180 lb, multiply that number by 703, which equals 126,540. 3. Measure your height in inches. Then multiply that number by itself to get a measurement called "inches squared." ? For example, for a person who is 70 inches tall, the "inches squared" measurement is 70 inches x 70 inches, which equals 4,900 inches squared. 4. Divide the total from step 2 (number of lb x 703) by the total from step 3 (inches squared): 126,540  4,900 = 25.8. This is  your BMI. To calculate your BMI in metric measurements: 1. Measure your weight in kilograms (kg). 2. Measure your height in meters (m). Then multiply that number by itself to get a measurement called "meters squared." ? For example, for a person who is 1.75 m tall, the "meters squared" measurement is 1.75 m x 1.75 m, which is equal to 3.1 meters squared. 3. Divide the number of kilograms (your weight) by the meters squared number. In this example: 70  3.1 = 22.6. This is your BMI. What do the results mean? BMI charts are used to identify whether you are underweight, normal weight, overweight, or obese. The following guidelines will be used:  Underweight: BMI less than 18.5.  Normal weight: BMI between 18.5 and 24.9.  Overweight: BMI between 25 and 29.9.  Obese: BMI of 30 or above. Keep these notes in mind:  Weight includes both fat and muscle, so someone with a muscular build, such as an athlete, may have a BMI that is higher than 24.9. In cases like these, BMI is not an accurate measure of body fat.  To determine if excess body fat is the cause of a BMI of 25 or higher, further assessments may need to be done by a health care provider.  BMI is usually interpreted in the same way for men and women. Where to find more information For more information about BMI, including tools to quickly calculate your BMI, go to these websites:  Centers for Disease Control and Prevention: http://www.wolf.info/  American  Heart Association: www.heart.org  National Heart, Lung, and Blood Institute: https://wilson-eaton.com/ Summary  Body mass index (BMI) is a number that is calculated from a person's weight and height.  BMI may help estimate how much of a person's weight is composed of fat. BMI can help identify those who may be at higher risk for certain medical problems.  BMI can be measured using English measurements or metric measurements.  BMI charts are used to identify whether you are underweight, normal  weight, overweight, or obese. This information is not intended to replace advice given to you by your health care provider. Make sure you discuss any questions you have with your health care provider. Document Revised: 06/23/2019 Document Reviewed: 04/30/2019 Elsevier Patient Education  [place].  Obesity, Adult Obesity is the condition of having too much total body fat. Being overweight or obese means that your weight is greater than what is considered healthy for your body size. Obesity is determined by a measurement called BMI. BMI is an estimate of body fat and is calculated from height and weight. For adults, a BMI of 30 or higher is considered obese. Obesity can lead to other health concerns and major illnesses, including:  Stroke.  Coronary artery disease (CAD).  Type 2 diabetes.  Some types of cancer, including cancers of the colon, breast, uterus, and gallbladder.  Osteoarthritis.  High blood pressure (hypertension).  High cholesterol.  Sleep apnea.  Gallbladder stones.  Infertility problems. What are the causes? Common causes of this condition include:  Eating daily meals that are high in calories, sugar, and fat.  Being born with genes that may make you more likely to become obese.  Having a medical condition that causes obesity, including: ? Hypothyroidism. ? Polycystic ovarian syndrome (PCOS). ? Binge-eating disorder. ? Cushing syndrome.  Taking certain medicines, such as steroids, antidepressants, and seizure medicines.  Not being physically active (sedentary lifestyle).  Not getting enough sleep.  Drinking high amounts of sugar-sweetened beverages, such as soft drinks. What increases the risk? The following factors may make you more likely to develop this condition:  Having a family history of obesity.  Being a woman of African American descent.  Being a man of Hispanic descent.  Living in an area with limited access to: ? [place]ilda [place],  recreation centers, or sidewalks. ? Healthy food choices, such as grocery stores and farmers' markets. What are the signs or symptoms? The main sign of this condition is having too much body fat. How is this diagnosed? This condition is diagnosed based on:  Your BMI. If you are an adult with a BMI of 30 or higher, you are considered obese.  Your waist circumference. This measures the distance around your waistline.  Your skinfold thickness. Your health care provider may gently pinch a fold of your skin and measure it. You may have other tests to check for underlying conditions. How is this treated? Treatment for this condition often includes changing your lifestyle. Treatment may include some or all of the following:  Dietary changes. This may include developing a healthy meal plan.  Regular physical activity. This may include activity that causes your heart to beat faster (aerobic exercise) and strength training. Work with your health care provider to design an exercise program that works for you.  Medicine to help you lose weight if you are unable to lose 1 pound a week after 6 weeks of healthy eating and more physical activity.  Treating conditions that cause the obesity (underlying conditions).  Surgery.  Surgical options may include gastric banding and gastric bypass. Surgery may be done if: ? Other treatments have not helped to improve your condition. ? You have a BMI of 40 or higher. ? You have life-threatening health problems related to obesity. Follow these instructions at home: Eating and drinking   Follow recommendations from your health care provider about what you eat and drink. Your health care provider may advise you to: ? Limit fast food, sweets, and processed snack foods. ? Choose low-fat options, such as low-fat milk instead of whole milk. ? Eat 5 or more servings of fruits or vegetables every day. ? Eat at home more often. This gives you more control over what  you eat. ? Choose healthy foods when you eat out. ? Learn to read food labels. This will help you understand how much food is considered 1 serving. ? Learn what a healthy serving size is. ? Keep low-fat snacks available. ? Limit sugary drinks, such as soda, fruit juice, sweetened iced tea, and flavored milk.  Drink enough water to keep your urine pale yellow.  Do not follow a fad diet. Fad diets can be unhealthy and even dangerous. Physical activity  Exercise regularly, as told by your health care provider. ? Most adults should get up to 150 minutes of moderate-intensity exercise every week. ? Ask your health care provider what types of exercise are safe for you and how often you should exercise.  Warm up and stretch before being active.  Cool down and stretch after being active.  Rest between periods of activity. Lifestyle  Work with your health care provider and a dietitian to set a weight-loss goal that is healthy and reasonable for you.  Limit your screen time.  Find ways to reward yourself that do not involve food.  Do not drink alcohol if: ? Your health care provider tells you not to drink. ? You are pregnant, may be pregnant, or are planning to become pregnant.  If you drink alcohol: ? Limit how much you use to:  0-1 drink a day for women.  0-2 drinks a day for men. ? Be aware of how much alcohol is in your drink. In the [place]., one drink equals one 12 oz bottle of beer (355 mL), one 5 oz glass of wine (148 mL), or one 1 oz glass of hard liquor (44 mL). General instructions  Keep a weight-loss journal to keep track of the food you eat and how much exercise you get.  Take over-the-counter and prescription medicines only as told by your health care provider.  Take vitamins and supplements only as told by your health care provider.  Consider joining a support group. Your health care provider may be able to recommend a support group.  Keep all follow-up visits as  told by your health care provider. This is important. Contact a health care provider if:  You are unable to meet your weight loss goal after 6 weeks of dietary and lifestyle changes. Get help right away if you are having:  Trouble breathing.  Suicidal thoughts or behaviors. Summary  Obesity is the condition of having too much total body fat.  Being overweight or obese means that your weight is greater than what is considered healthy for your body size.  Work with your health care provider and a dietitian to set a weight-loss goal that is healthy and reasonable for you.  Exercise regularly, as told by your health care provider. Ask your health care provider what types  of exercise are safe for you and how often you should exercise. This information is not intended to replace advice given to you by your health care provider. Make sure you discuss any questions you have with your health care provider. Document Revised: 06/04/2018 Document Reviewed: 06/04/2018 Elsevier Patient Education  2020 [employer].

## 2020-03-31 ENCOUNTER — Encounter: Payer: Self-pay | Admitting: Gastroenterology

## 2020-03-31 LAB — COMPREHENSIVE METABOLIC PANEL
ALT: 13 U/L (ref 0–35)
AST: 16 U/L (ref 0–37)
Albumin: 4.2 g/dL (ref 3.5–5.2)
Alkaline Phosphatase: 110 U/L (ref 39–117)
BUN: 14 mg/dL (ref 6–23)
CO2: 29 mEq/L (ref 19–32)
Calcium: 9.8 mg/dL (ref 8.4–10.5)
Chloride: 105 mEq/L (ref 96–112)
Creatinine, Ser: 0.81 mg/dL (ref 0.40–1.20)
GFR: 87.7 mL/min (ref >60.00)
Glucose, Bld: 91 mg/dL (ref 70–99)
Potassium: 4 mEq/L (ref 3.5–5.1)
Sodium: 139 mEq/L (ref 135–145)
Total Bilirubin: 0.3 mg/dL (ref 0.2–1.2)
Total Protein: 7.1 g/dL (ref 6.0–8.3)

## 2020-03-31 LAB — CBC
HCT: 36 % (ref 36.0–46.0)
Hemoglobin: 11.7 g/dL — ABNORMAL LOW (ref 12.0–15.0)
MCHC: 32.6 g/dL (ref 30.0–36.0)
MCV: 81.1 fl (ref 78.0–100.0)
Platelets: 317 10*3/uL (ref 150.0–400.0)
RBC: 4.44 Mil/uL (ref 3.87–5.11)
RDW: 14.5 % (ref 11.5–15.5)
WBC: 4.9 10*3/uL (ref 4.0–10.5)

## 2020-03-31 LAB — URINALYSIS, ROUTINE W REFLEX MICROSCOPIC
Bilirubin Urine: NEGATIVE
Ketones, ur: NEGATIVE
Leukocytes,Ua: NEGATIVE
Nitrite: NEGATIVE
Specific Gravity, Urine: 1.025 (ref 1.000–1.030)
Total Protein, Urine: NEGATIVE
Urine Glucose: NEGATIVE
Urobilinogen, UA: 0.2 (ref 0.0–1.0)
pH: 6 (ref 5.0–8.0)

## 2020-03-31 LAB — IRON,TIBC AND FERRITIN PANEL
%SAT: 23 % (calc) (ref 16–45)
Ferritin: 83 ng/mL (ref 16–232)
Iron: 71 ug/dL (ref 45–160)
TIBC: 310 mcg/dL (calc) (ref 250–450)

## 2020-03-31 LAB — HEPATITIS C ANTIBODY
Hepatitis C Ab: NONREACTIVE
SIGNAL TO CUT-OFF: 0.01 (ref <1.00)

## 2020-03-31 LAB — HIV ANTIBODY (ROUTINE TESTING W REFLEX): HIV 1&2 Ab, 4th Generation: NONREACTIVE

## 2020-03-31 LAB — B12 AND FOLATE PANEL
Folate: 15.1 ng/mL (ref >5.9)
Vitamin B-12: 565 pg/mL (ref 211–911)

## 2020-03-31 LAB — VITAMIN D 25 HYDROXY (VIT D DEFICIENCY, FRACTURES): VITD: 25.57 ng/mL — ABNORMAL LOW (ref 30.00–100.00)

## 2020-03-31 LAB — TSH: TSH: 2.98 u[IU]/mL (ref 0.35–4.50)

## 2020-03-31 LAB — HEMOGLOBIN A1C: Hgb A1c MFr Bld: 6.5 % (ref 4.6–6.5)

## 2020-03-31 LAB — LDL CHOLESTEROL, DIRECT: Direct LDL: 112 mg/dL

## 2020-04-04 DIAGNOSIS — E559 Vitamin D deficiency, unspecified: Secondary | ICD-10-CM | POA: Insufficient documentation

## 2020-04-04 DIAGNOSIS — E611 Iron deficiency: Secondary | ICD-10-CM | POA: Insufficient documentation

## 2020-04-04 MED ORDER — VITAMIN D (ERGOCALCIFEROL) 1.25 MG (50000 UNIT) PO CAPS: 50000.0000 [IU] | ORAL_CAPSULE | ORAL | 0 refills | Status: DC

## 2020-04-04 NOTE — Addendum Note (Signed)
Addended by: Jon Billings on: 04/04/2020 04:51 PM   Modules accepted: Orders

## 2020-04-05 ENCOUNTER — Other Ambulatory Visit: Payer: Self-pay

## 2020-04-05 MED ORDER — FERROUS SULFATE 325 (65 FE) MG PO TABS: 325.0000 mg | ORAL_TABLET | Freq: Two times a day (BID) | ORAL | 0 refills | Status: DC

## 2020-04-13 ENCOUNTER — Other Ambulatory Visit: Payer: Self-pay

## 2020-04-13 ENCOUNTER — Ambulatory Visit (HOSPITAL_BASED_OUTPATIENT_CLINIC_OR_DEPARTMENT_OTHER)
Admission: RE | Admit: 2020-04-13 | Discharge: 2020-04-13 | Disposition: A | Discharge status: Home / Self Care | Payer: BC Managed Care – PPO | Source: Ambulatory Visit | Attending: Family Medicine | Admitting: Family Medicine

## 2020-04-13 DIAGNOSIS — Z0000 Encounter for general adult medical examination without abnormal findings: Secondary | ICD-10-CM

## 2020-04-13 DIAGNOSIS — Z1231 Encounter for screening mammogram for malignant neoplasm of breast: Secondary | ICD-10-CM | POA: Diagnosis not present

## 2020-04-24 ENCOUNTER — Telehealth: Payer: Self-pay | Admitting: Family Medicine

## 2020-04-24 NOTE — Telephone Encounter (Signed)
I talked to pt about [facility] to [facility] she said she did talk to someone there and that they told her how much it was going to be since she hasnt met her deductible yet and pt said she is just going to go through the program for her work because it offers more and will be cheaper. Pt said to cancel referral

## 2020-04-28 ENCOUNTER — Telehealth: Payer: Self-pay | Admitting: Neurology

## 2020-04-28 DIAGNOSIS — H814 Vertigo of central origin: Secondary | ICD-10-CM

## 2020-04-28 NOTE — Telephone Encounter (Signed)
Pt is asking for a call for the re scheduling of her CT scan

## 2020-05-01 ENCOUNTER — Telehealth: Payer: Self-pay | Admitting: Family Medicine

## 2020-05-01 MED ORDER — ALPRAZOLAM 0.5 MG PO TABS: ORAL_TABLET | ORAL | 0 refills | Status: DC

## 2020-05-01 NOTE — Telephone Encounter (Signed)
I called the patient.  MRI of the brain was never done because of claustrophobia.  The patient was on a mobile scanner, we will retry the scan on an open scanner at [place].  I will reorder the study.

## 2020-05-01 NOTE — Telephone Encounter (Signed)
LVM

## 2020-05-01 NOTE — Addendum Note (Signed)
Addended by: Kathrynn Ducking on: 05/01/2020 04:56 PM   Modules accepted: Orders

## 2020-05-01 NOTE — Telephone Encounter (Signed)
Patient is returning the call. CB is [phone]

## 2020-05-01 NOTE — Telephone Encounter (Signed)
Spoke to pt, she states she would like orders placed for CT, but she is concerned about being in the machine. She would also like to know if she could be sedated

## 2020-05-02 ENCOUNTER — Telehealth: Payer: Self-pay | Admitting: Neurology

## 2020-05-02 NOTE — Telephone Encounter (Signed)
I called for a peer review, MRI of the brain has been accepted, confirmation number is [id number] good until October 28, 2020.

## 2020-05-02 NOTE — Telephone Encounter (Signed)
Dr. Jannifer Franklin , [employer] is stating MRI W/WO [id number] does not meet criteria . Can you please call for me  [phone] Options 1 , 1 and then 2 . Thanks Hinton Dyer

## 2020-05-03 ENCOUNTER — Telehealth: Payer: Self-pay | Admitting: Family Medicine

## 2020-05-03 NOTE — Telephone Encounter (Signed)
We have received FMLA paperwork faxed by [employer]. Patient would like Dr. Ethelene Hal to call her regarding additionl information for this paperwork. She stated it was too much information to leave in this message.

## 2020-05-03 NOTE — Telephone Encounter (Signed)
Patient states she was referred to Dr. Narda Amber at [facility] who will be out on maternity leave until late August. Patient wants to keep the referral to that doctor, but needs meds sooner. Can Dr. Ethelene Hal prescribe medications to get patient through to appt with Neurology.

## 2020-05-05 DIAGNOSIS — Z113 Encounter for screening for infections with a predominantly sexual mode of transmission: Secondary | ICD-10-CM | POA: Diagnosis not present

## 2020-05-05 DIAGNOSIS — Z711 Person with feared health complaint in whom no diagnosis is made: Secondary | ICD-10-CM | POA: Diagnosis not present

## 2020-05-05 DIAGNOSIS — Z01419 Encounter for gynecological examination (general) (routine) without abnormal findings: Secondary | ICD-10-CM | POA: Diagnosis not present

## 2020-05-09 NOTE — Telephone Encounter (Signed)
05/03/20 PT is getting 2nd opinion, will call back to schedule/AC 05/03/20 ok to schedule//taron

## 2020-05-09 NOTE — Telephone Encounter (Signed)
Patient called back and wanted to speak to someone regarding if her FMLA paperwork has be completed and sent back, please advise. CB is [phone]

## 2020-05-12 ENCOUNTER — Other Ambulatory Visit: Payer: Self-pay

## 2020-05-12 ENCOUNTER — Ambulatory Visit (AMBULATORY_SURGERY_CENTER): Payer: Self-pay | Admitting: *Deleted

## 2020-05-12 VITALS — Ht 66.0 in | Wt 221.0 lb

## 2020-05-12 DIAGNOSIS — Z1211 Encounter for screening for malignant neoplasm of colon: Secondary | ICD-10-CM

## 2020-05-12 MED ORDER — PLENVU 140 G PO SOLR: 1.0000 | ORAL | 0 refills | Status: DC

## 2020-05-12 NOTE — Progress Notes (Signed)
8-11 wed covid test   No egg or soy allergy known to patient  No issues with past sedation with any surgeries or procedures no intubation problems in the past  No diet pills per patient No home 02 use per patient  No blood thinners per patient  Pt denies issues with constipation  No A fib or A flutter  EMMI video to pt or MyChart  COVID 19 guidelines implemented in [place] today   Pt states cannot take Sutab,- she cannot swallow the large tablets -- Clenpiq, Suprep, Plenvu not covered, only Golytely and pt states cannot drink that volume- will do Plenvu sample - Lot [id number]  Exp 11/2020  Due to the COVID-19 pandemic we are asking patients to follow these guidelines. Please only bring one care partner. Please be aware that your care partner may wait in the car in the parking lot or if they feel like they will be too hot to wait in the car, they may wait in the lobby on the 4th floor. All care partners are required to wear a mask the entire time (we do not have any that we can provide them), they need to practice social distancing, and we will do a Covid check for all patient's and care partners when you arrive. Also we will check their temperature and your temperature. If the care partner waits in their car they need to stay in the parking lot the entire time and we will call them on their cell phone when the patient is ready for discharge so they can bring the car to the front of the building. Also all patient's will need to wear a mask into building.

## 2020-05-15 ENCOUNTER — Encounter: Payer: Self-pay | Admitting: Gastroenterology

## 2020-05-18 NOTE — Telephone Encounter (Signed)
Appointment scheduled for [facility] to discuss forms.

## 2020-05-24 ENCOUNTER — Other Ambulatory Visit: Payer: Self-pay

## 2020-05-24 ENCOUNTER — Ambulatory Visit (INDEPENDENT_AMBULATORY_CARE_PROVIDER_SITE_OTHER): Payer: BC Managed Care – PPO

## 2020-05-24 ENCOUNTER — Other Ambulatory Visit: Payer: Self-pay | Admitting: Gastroenterology

## 2020-05-24 DIAGNOSIS — Z1159 Encounter for screening for other viral diseases: Secondary | ICD-10-CM

## 2020-05-24 LAB — SARS CORONAVIRUS 2 (TAT 6-24 HRS): SARS Coronavirus 2: NEGATIVE

## 2020-05-25 ENCOUNTER — Encounter: Payer: Self-pay | Admitting: Family Medicine

## 2020-05-25 ENCOUNTER — Other Ambulatory Visit: Payer: Self-pay

## 2020-05-25 ENCOUNTER — Ambulatory Visit: Payer: BC Managed Care – PPO | Admitting: Family Medicine

## 2020-05-25 VITALS — BP 136/78 | HR 69 | Temp 98.6°F | Ht 66.0 in | Wt 225.0 lb

## 2020-05-25 DIAGNOSIS — I10 Essential (primary) hypertension: Secondary | ICD-10-CM

## 2020-05-25 DIAGNOSIS — G43909 Migraine, unspecified, not intractable, without status migrainosus: Secondary | ICD-10-CM | POA: Diagnosis not present

## 2020-05-25 DIAGNOSIS — E559 Vitamin D deficiency, unspecified: Secondary | ICD-10-CM | POA: Diagnosis not present

## 2020-05-25 DIAGNOSIS — E611 Iron deficiency: Secondary | ICD-10-CM | POA: Diagnosis not present

## 2020-05-25 DIAGNOSIS — H8303 Labyrinthitis, bilateral: Secondary | ICD-10-CM | POA: Insufficient documentation

## 2020-05-25 DIAGNOSIS — G7000 Myasthenia gravis without (acute) exacerbation: Secondary | ICD-10-CM

## 2020-05-25 MED ORDER — RIZATRIPTAN BENZOATE 10 MG PO TABS: 10.0000 mg | ORAL_TABLET | ORAL | 0 refills | Status: DC | PRN

## 2020-05-25 MED ORDER — FERROUS SULFATE 325 (65 FE) MG PO TABS: 325.0000 mg | ORAL_TABLET | Freq: Two times a day (BID) | ORAL | 2 refills | Status: DC

## 2020-05-25 MED ORDER — TOPIRAMATE 25 MG PO TABS: ORAL_TABLET | ORAL | 3 refills | Status: DC

## 2020-05-25 MED ORDER — MECLIZINE HCL 12.5 MG PO TABS: 12.5000 mg | ORAL_TABLET | Freq: Three times a day (TID) | ORAL | 1 refills | Status: AC | PRN

## 2020-05-25 MED ORDER — VITAMIN D (ERGOCALCIFEROL) 1.25 MG (50000 UNIT) PO CAPS: 50000.0000 [IU] | ORAL_CAPSULE | ORAL | 0 refills | Status: DC

## 2020-05-25 NOTE — Progress Notes (Signed)
Established Patient Office Visit  Subjective:  Patient ID: [name], female    DOB: May 19, 1961  Age: 59 y.o. MRN: [id number]  CC:  Chief Complaint  Patient presents with  . Follow-up    dizzy spells, nausea, bodyaches and headaches patient states that symptoms becoming worse. Needing FMLA forms filled out.     HPI [name] presents for follow-up of her blood pressure, vitamin D deficiency, and need anemia, iron deficiency, migraine headaches and labyrinthitis.  Has not been in to see Dr. Jannifer Franklin for an appointment.  Could not be seen by the neurologist of her choice because of missed appointments at that practice some years ago.  She is run out of iron and vitamin D and has not been taking them.  Past Medical History:  Diagnosis Date  . Allergy   . Anemia    on med - caltrate  . Anxiety   . Blood transfusion    09/2010  . Chronic low back pain 09/21/2019  . Enlarged heart    no problems per patient  . GERD (gastroesophageal reflux disease)    diet -  otc med prn  . Headache(784.0)    tx with otc ibuprofen  . Hypertension    on meds  . Migraine headache 09/21/2019  . Myasthenia gravis    rare - no current problems - if flares just affects eyes -see double- now entire body 05-12-2020  . Pre-diabetes   . Shortness of breath    with exertion  . Vertigo 09/21/2019    Past Surgical History:  Procedure Laterality Date  . APPENDECTOMY     age 32 yrs  . CESAREAN SECTION     2000 - FTP  . MYOMECTOMY     abd myomectomy 1990's  . robotic supracervical hysterectomy  04/2011  . WISDOM TOOTH EXTRACTION      Family History  Problem Relation Age of Onset  . Heart failure Father 20  . Hypertension Father   . Lung cancer Brother 80  . Heart disease Brother 9       CAD/CHF  . Cancer Daughter 12       lumphomia   . Hypertension Mother   . Diabetes Mother   . Thyroid nodules Mother   . Heart disease Mother 23       CHF  . Colon polyps Mother   . Cancer Brother          Lung cancer  . Anesthesia problems Neg Hx   . Hypotension Neg Hx   . Malignant hyperthermia Neg Hx   . Pseudochol deficiency Neg Hx   . Crohn's disease Neg Hx   . Esophageal cancer Neg Hx   . Rectal cancer Neg Hx   . Stomach cancer Neg Hx     Social History   Socioeconomic History  . Marital status: Single    Spouse name: Not on file  . Number of children: Not on file  . Years of education: Not on file  . Highest education level: Not on file  Occupational History  . Not on file  Tobacco Use  . Smoking status: Former Smoker    Quit date: 10/14/1980    Years since quitting: 39.6  . Smokeless tobacco: Never Used  Vaping Use  . Vaping Use: Never used  Substance and Sexual Activity  . Alcohol use: Yes    Comment: occasionally - 1-2 month - wine  . Drug use: No  . Sexual activity: Never  Birth control/protection: Surgical    Comment: hyst  Other Topics Concern  . Not on file  Social History Narrative   Marital status: single      Children: 1 daugther.      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 Difficulty of Paying Living Expenses:   Food Insecurity:   . Worried About Charity fundraiser in the Last Year:   . Arboriculturist in the Last Year:   Transportation Needs:   . Film/video editor (Medical):   Marland Kitchen Lack of Transportation (Non-Medical):   Physical Activity:   . Days of Exercise per Week:   . Minutes of Exercise per Session:   Stress:   . Feeling of Stress :   Social Connections:   . Frequency of Communication with Friends and Family:   . Frequency of Social Gatherings with Friends and Family:   . Attends Religious Services:   . Active Member of Clubs or Organizations:   . Attends Archivist Meetings:   Marland Kitchen Marital Status:   Intimate Partner Violence:   .  Fear of Current or Ex-Partner:   . Emotionally Abused:   Marland Kitchen Physically Abused:   . Sexually Abused:     Outpatient Medications Prior to Visit  Medication Sig Dispense Refill  . lisinopril-hydrochlorothiazide (PRINZIDE,ZESTORETIC) 20-25 MG tablet TAKE 1 TABLET BY MOUTH EVERY DAY 90 tablet 1  . metoprolol succinate (TOPROL-XL) 25 MG 24 hr tablet TAKE 1 TABLET BY MOUTH EVERY DAY 90 tablet 1  . multivitamin (THERAGRAN) per tablet Take 1 tablet by mouth daily.      . rizatriptan (MAXALT) 10 MG tablet Take 1 tablet (10 mg total) by mouth 3 (three) times daily as needed for migraine. 10 tablet 2  . meclizine (ANTIVERT) 12.5 MG tablet Take 1 tablet (12.5 mg total) by mouth 3 (three) times daily as needed for dizziness. 12 tablet 0  . topiramate (TOPAMAX) 25 MG tablet Take one tablet at night for one week, then take 2 tablets at night for one week, then take 3 tablets at night. 90 tablet 3  . PEG-KCl-NaCl-NaSulf-Na Asc-C (PLENVU) 140 g SOLR Take 1 kit by mouth as directed. (Patient not taking: Reported on 05/25/2020) 1 each 0  . ALPRAZolam (XANAX) 0.5 MG tablet Take 2 tablets approximately 45 minutes prior to the MRI study, take a third tablet if needed. (Patient not taking: Reported on 05/12/2020) 3 tablet 0  . ferrous sulfate 325 (65 FE) MG tablet Take 1 tablet (325 mg total) by mouth 2 (two) times daily with a meal. (Patient not taking: Reported on 05/25/2020) 30 tablet 0  . Vitamin D, Ergocalciferol, (DRISDOL) 1.25 MG (50000 UNIT) CAPS capsule Take 1 capsule (50,000 Units total) by mouth every 7 (seven) days. (Patient not taking: Reported on 05/25/2020) 30 capsule 0   No facility-administered medications prior to visit.    Allergies  Allergen Reactions  . Codeine Nausea And Vomiting and Other (See Comments)    Dizziness,  rooom spends  . Etodolac Nausea Only  . Hydrocodone-Acetaminophen Nausea And Vomiting    ROS Review of Systems  Constitutional: Negative.   HENT: Negative.   Eyes: Negative  for photophobia and visual disturbance.  Respiratory: Negative.   Cardiovascular: Negative.   Gastrointestinal: Negative.   Endocrine: Negative for polyphagia and polyuria.  Musculoskeletal: Negative for gait problem and joint swelling.  Skin: Negative for pallor and rash.  Allergic/Immunologic: Negative for immunocompromised state.  Neurological: Positive for dizziness and headaches.  Hematological: Does not bruise/bleed easily.  Psychiatric/Behavioral: Negative.       Objective:    Physical Exam Vitals reviewed.  Constitutional:      General: She is not in acute distress.    Appearance: Normal appearance. She is obese. She is not ill-appearing, toxic-appearing or diaphoretic.  HENT:     Head: Normocephalic and atraumatic.     Right Ear: Tympanic membrane, ear canal and external ear normal.     Left Ear: Tympanic membrane, ear canal and external ear normal.  Eyes:     General: No scleral icterus.       Left eye: No discharge.     Extraocular Movements: Extraocular movements intact.     Conjunctiva/sclera: Conjunctivae normal.     Pupils: Pupils are equal, round, and reactive to light.  Cardiovascular:     Rate and Rhythm: Normal rate and regular rhythm.  Pulmonary:     Effort: Pulmonary effort is normal.     Breath sounds: Normal breath sounds.  Abdominal:     General: Bowel sounds are normal.  Musculoskeletal:     Cervical back: No rigidity or tenderness.     Right lower leg: No edema.     Left lower leg: No edema.  Lymphadenopathy:     Cervical: No cervical adenopathy.  Skin:    General: Skin is warm and dry.  Neurological:     General: No focal deficit present.     Mental Status: She is alert and oriented to person, place, and time.  Psychiatric:        Mood and Affect: Mood normal.        Behavior: Behavior normal.     BP 136/78   Pulse 69   Temp 98.6 F (37 C) (Tympanic)   Ht _0  (1.676 m)   Wt 225 lb (102.1 kg)   LMP 01/04/2011   SpO2 95%   BMI  36.32 kg/m  Wt Readings from Last 3 Encounters:  05/25/20 225 lb (102.1 kg)  05/12/20 (!) 221 lb (100.2 kg)  03/30/20 225 lb 9.6 oz (102.3 kg)     Health Maintenance Due  Topic Date Due  . COVID-19 Vaccine (1) Never done  . TETANUS/TDAP  Never done  . COLONOSCOPY  Never done  . PAP SMEAR-Modifier  08/07/2015  . INFLUENZA VACCINE  05/14/2020    There are no preventive care reminders to display for this patient.  Lab Results  Component Value Date   TSH 2.98 03/30/2020   Lab Results  Component Value Date   WBC 4.9 03/30/2020   HGB 11.7 (L) 03/30/2020   HCT 36.0 03/30/2020   MCV 81.1 03/30/2020   PLT 317.0 03/30/2020   Lab Results  Component Value Date   NA 139 03/30/2020   K 4.0 03/30/2020   CO2 29 03/30/2020   GLUCOSE 91 03/30/2020   BUN 14 03/30/2020   CREATININE 0.81 03/30/2020   BILITOT 0.3 03/30/2020   ALKPHOS 110 03/30/2020   AST 16 03/30/2020   ALT 13 03/30/2020   PROT 7.1 03/30/2020   ALBUMIN 4.2 03/30/2020   CALCIUM 9.8 03/30/2020   ANIONGAP 9 08/13/2019   GFR 87.70 03/30/2020   Lab Results  Component Value  Date   CHOL 146 05/13/2015   Lab Results  Component Value Date   HDL 48 05/13/2015   Lab Results  Component Value Date   LDLCALC 85 05/13/2015   Lab Results  Component Value Date   TRIG 63 05/13/2015   Lab Results  Component Value Date   CHOLHDL 3.0 05/13/2015   Lab Results  Component Value Date   HGBA1C 6.5 03/30/2020      Assessment & Plan:   Problem List Items Addressed This Visit      Cardiovascular and Mediastinum   Essential hypertension - Primary   Migraine without status migrainosus, not intractable   Relevant Medications   topiramate (TOPAMAX) 25 MG tablet   rizatriptan (MAXALT) 10 MG tablet     Nervous and Auditory   Myasthenia gravis (HCC)   Relevant Medications   topiramate (TOPAMAX) 25 MG tablet   Labyrinthitis of both ears   Relevant Medications   meclizine (ANTIVERT) 12.5 MG tablet     Other   Iron  deficiency   Relevant Medications   ferrous sulfate 325 (65 FE) MG tablet   Vitamin D deficiency   Relevant Medications   Vitamin D, Ergocalciferol, (DRISDOL) 1.25 MG (50000 UNIT) CAPS capsule      Meds ordered this encounter  Medications  . ferrous sulfate 325 (65 FE) MG tablet    Sig: Take 1 tablet (325 mg total) by mouth 2 (two) times daily with a meal.    Dispense:  30 tablet    Refill:  2  . topiramate (TOPAMAX) 25 MG tablet    Sig: Take one tablet at night for one week, then take 2 tablets at night for one week, then take 3 tablets at night.    Dispense:  90 tablet    Refill:  3  . Vitamin D, Ergocalciferol, (DRISDOL) 1.25 MG (50000 UNIT) CAPS capsule    Sig: Take 1 capsule (50,000 Units total) by mouth every 7 (seven) days.    Dispense:  30 capsule    Refill:  0  . meclizine (ANTIVERT) 12.5 MG tablet    Sig: Take 1 tablet (12.5 mg total) by mouth 3 (three) times daily as needed for dizziness.    Dispense:  40 tablet    Refill:  1  . rizatriptan (MAXALT) 10 MG tablet    Sig: Take 1 tablet (10 mg total) by mouth as needed for migraine. May repeat in 2 hours if needed    Dispense:  10 tablet    Refill:  0    Follow-up: Return in about 3 months (around 08/25/2020), or further refills for maxalt, topamax and antivert should come from neurology., for return in 3 mos for recheck of blood pressure, vit d, iron andhemoglobin. Libby Maw, MD

## 2020-05-26 ENCOUNTER — Encounter: Payer: Self-pay | Admitting: Gastroenterology

## 2020-05-26 ENCOUNTER — Ambulatory Visit (AMBULATORY_SURGERY_CENTER): Payer: BC Managed Care – PPO | Admitting: Gastroenterology

## 2020-05-26 VITALS — BP 135/73 | HR 69 | Temp 97.5°F | Resp 16 | Ht 66.0 in | Wt 221.0 lb

## 2020-05-26 DIAGNOSIS — D123 Benign neoplasm of transverse colon: Secondary | ICD-10-CM | POA: Diagnosis not present

## 2020-05-26 DIAGNOSIS — Z1211 Encounter for screening for malignant neoplasm of colon: Secondary | ICD-10-CM

## 2020-05-26 DIAGNOSIS — D120 Benign neoplasm of cecum: Secondary | ICD-10-CM

## 2020-05-26 MED ORDER — SODIUM CHLORIDE 0.9 % IV SOLN: 500.0000 mL | Freq: Once | INTRAVENOUS | Status: DC

## 2020-05-26 NOTE — Progress Notes (Signed)
Pt's states no medical or surgical changes since previsit or office visit. 

## 2020-05-26 NOTE — Progress Notes (Signed)
Report to PACU, RN, vss, BBS= Clear.  

## 2020-05-26 NOTE — Op Note (Signed)
[facility] Patient Name: [name] Procedure Date: 05/26/2020 10:12 AM MRN: [id number] Endoscopist: Jackquline Denmark , MD Age: 59 Referring MD:  Date of Birth: 1961/04/25 Gender: Female Account #: [id number] Procedure:                Colonoscopy Indications:              Screening for colorectal malignant neoplasm Medicines:                Monitored Anesthesia Care Procedure:                Pre-Anesthesia Assessment:                           - Prior to the procedure, a History and Physical                            was performed, and patient medications and                            allergies were reviewed. The patient's tolerance of                            previous anesthesia was also reviewed. The risks                            and benefits of the procedure and the sedation                            options and risks were discussed with the patient.                            All questions were answered, and informed consent                            was obtained. Prior Anticoagulants: The patient has                            taken no previous anticoagulant or antiplatelet                            agents. ASA Grade Assessment: II - A patient with                            mild systemic disease. After reviewing the risks                            and benefits, the patient was deemed in                            satisfactory condition to undergo the procedure.                           After obtaining informed consent, the colonoscope  was passed under direct vision. Throughout the                            procedure, the patient's blood pressure, pulse, and                            oxygen saturations were monitored continuously. The                            CF H190 Colonoscope was introduced through the anus                            and advanced to the the cecum, identified by                            appendiceal orifice and  ileocecal valve. The                            colonoscopy was performed without difficulty. The                            patient tolerated the procedure well. The quality                            of the bowel preparation was good. The ileocecal                            valve, appendiceal orifice, and rectum were                            photographed. Scope In: 10:25:27 AM Scope Out: 10:50:51 AM Scope Withdrawal Time: 0 hours 21 minutes 23 seconds  Total Procedure Duration: 0 hours 25 minutes 24 seconds  Findings:                 A 12 mm flat sessile polyp was found in the cecum.                            The polyp was sessile. The polyp was removed with a                            piecemeal technique using a hot snare. Resection                            and retrieval were complete. Estimated blood loss:                            none.                           A 20 mm large polyp was found in the proximal                            transverse colon. The polyp was semi-pedunculated  on a thick pedicle. The polyp was removed with a                            hot snare. Resection and retrieval were complete.                            Area was tattooed with an injection of 1 mL of                            [place] ink at the contralateral wall. The polyp was                            retrieved by Jabier Mutton basket. Scope then inserted again                            to the site of polypectomy and withdrawn slowly.                           Non-bleeding internal hemorrhoids were found during                            retroflexion. The hemorrhoids were small.                           The exam was otherwise without abnormality on                            direct and retroflexion views. Complications:            No immediate complications. Estimated Blood Loss:     Estimated blood loss: none. Impression:               -Cecal polyp s/p piecemeal  polypectomy                           -Large proximal transverse colon, removed with a                            hot snare. Resected and retrieved. Tattooed.                           -Small internal hemorrhoids.                           -Otherwise normal colonoscopy. Recommendation:           - Patient has a contact number available for                            emergencies. The signs and symptoms of potential                            delayed complications were discussed with the                            patient.  Return to normal activities tomorrow.                            Written discharge instructions were provided to the                            patient. Patient had high risk for post polypectomy                            complications including bleeding due to size and                            location of polyps.                           - Resume previous diet.                           - Continue present medications.                           - No aspirin, ibuprofen, naproxen, or other                            non-steroidal anti-inflammatory drugs for 7 days                            after polyp removal.                           - Await pathology results.                           - Repeat colonoscopy for surveillance based on                            pathology results.                           - The findings and recommendations were discussed                            with the patient's Sister Raiford Noble. Recommend                            familial screening starting at age 50. Jackquline Denmark, MD 05/26/2020 11:00:58 AM This report has been signed electronically.

## 2020-05-26 NOTE — Patient Instructions (Addendum)
Handouts given:  Hemorrhoids, polyps Resume previous diet Continue present medications No aspirin , ibuprofen, naproxen or aleve for 7 days.  Await pathology results Recommend entire family start colonoscopy at 59 years old   YOU HAD AN ENDOSCOPIC PROCEDURE TODAY AT [facility]:   Refer to the procedure report that was given to you for any specific questions about what was found during the examination.  If the procedure report does not answer your questions, please call your gastroenterologist to clarify.  If you requested that your care partner not be given the details of your procedure findings, then the procedure report has been included in a sealed envelope for you to review at your convenience later.  YOU SHOULD EXPECT: Some feelings of bloating in the abdomen. Passage of more gas than usual.  Walking can help get rid of the air that was put into your GI tract during the procedure and reduce the bloating. If you had a lower endoscopy (such as a colonoscopy or flexible sigmoidoscopy) you may notice spotting of blood in your stool or on the toilet paper. If you underwent a bowel prep for your procedure, you may not have a normal bowel movement for a few days.  Please Note:  You might notice some irritation and congestion in your nose or some drainage.  This is from the oxygen used during your procedure.  There is no need for concern and it should clear up in a day or so.  SYMPTOMS TO REPORT IMMEDIATELY:   Following lower endoscopy (colonoscopy or flexible sigmoidoscopy):  Excessive amounts of blood in the stool  Significant tenderness or worsening of abdominal pains  Swelling of the abdomen that is new, acute  Fever of 100F or higher   For urgent or emergent issues, a gastroenterologist can be reached at any hour by calling [phone]. Do not use MyChart messaging for urgent concerns.    DIET:  We do recommend a small meal at first, but then you may proceed to  your regular diet.  Drink plenty of fluids but you should avoid alcoholic beverages for 24 hours.  ACTIVITY:  You should plan to take it easy for the rest of today and you should NOT DRIVE or use heavy machinery until tomorrow (because of the sedation medicines used during the test).    FOLLOW UP: Our staff will call the number listed on your records 48-72 hours following your procedure to check on you and address any questions or concerns that you may have regarding the information given to you following your procedure. If we do not reach you, we will leave a message.  We will attempt to reach you two times.  During this call, we will ask if you have developed any symptoms of COVID 19. If you develop any symptoms (ie: fever, flu-like symptoms, shortness of breath, cough etc.) before then, please call [phone].  If you test positive for Covid 19 in the 2 weeks post procedure, please call and report this information to [place].    If any biopsies were taken you will be contacted by phone or by letter within the next 1-3 weeks.  Please call us at [phone] if you have not heard about the biopsies in 3 weeks.    SIGNATURES/CONFIDENTIALITY: You and/or your care partner have signed paperwork which will be entered into your electronic medical record.  These signatures attest to the fact that that the information above on your After Visit Summary has been reviewed and  is understood.  Full responsibility of the confidentiality of this discharge information lies with you and/or your care-partner.

## 2020-05-26 NOTE — Progress Notes (Signed)
Called to room to assist during endoscopic procedure.  Patient ID and intended procedure confirmed with present staff. Received instructions for my participation in the procedure from the performing physician.  

## 2020-05-29 ENCOUNTER — Telehealth: Payer: Self-pay | Admitting: Family Medicine

## 2020-05-29 DIAGNOSIS — R002 Palpitations: Secondary | ICD-10-CM | POA: Diagnosis not present

## 2020-05-29 DIAGNOSIS — I10 Essential (primary) hypertension: Secondary | ICD-10-CM | POA: Diagnosis not present

## 2020-05-29 NOTE — Telephone Encounter (Signed)
Patient called and stated that she dropped off paperwork for Dr. Ethelene Hal to complete and fill out and wanted to see if the paperwork was completed. Patient stated that paperwork must be completed by 8/18, please advise. CB is [phone]

## 2020-05-30 ENCOUNTER — Telehealth: Payer: Self-pay

## 2020-05-30 ENCOUNTER — Telehealth: Payer: Self-pay | Admitting: *Deleted

## 2020-05-30 NOTE — Telephone Encounter (Signed)
Patient states that daughter [name] will be picking up forms for her. She's not feeling well.

## 2020-05-30 NOTE — Telephone Encounter (Signed)
First attempt, left VM.  

## 2020-05-30 NOTE — Telephone Encounter (Signed)
Patient aware form filled out but blank where she needs to fill out. Per patient she will stop by and pick up forms.

## 2020-05-30 NOTE — Telephone Encounter (Signed)
No answer, left message to call if having any issues or concerns, B.Vaneza Pickart RN 

## 2020-06-02 ENCOUNTER — Telehealth: Payer: Self-pay | Admitting: Family Medicine

## 2020-06-02 ENCOUNTER — Other Ambulatory Visit: Payer: Self-pay

## 2020-06-02 MED ORDER — LISINOPRIL-HYDROCHLOROTHIAZIDE 20-25 MG PO TABS: 1.0000 | ORAL_TABLET | Freq: Every day | ORAL | 1 refills | Status: DC

## 2020-06-02 MED ORDER — METOPROLOL SUCCINATE ER 25 MG PO TB24: 25.0000 mg | ORAL_TABLET | Freq: Every day | ORAL | 1 refills | Status: DC

## 2020-06-02 NOTE — Telephone Encounter (Signed)
Patient needs refills of lisinopril and metoprolol. She states she is already out of her medication. Please send to new pharmacy: [facility].

## 2020-06-02 NOTE — Telephone Encounter (Signed)
Refill request sent in

## 2020-06-04 ENCOUNTER — Encounter: Payer: Self-pay | Admitting: Gastroenterology

## 2020-06-08 ENCOUNTER — Telehealth: Payer: Self-pay | Admitting: Family Medicine

## 2020-06-08 NOTE — Telephone Encounter (Signed)
Patient dropped off FMLA paperwork. Needing additional info. Paperwork is in [place] folder in the front office

## 2020-06-09 ENCOUNTER — Telehealth (INDEPENDENT_AMBULATORY_CARE_PROVIDER_SITE_OTHER): Payer: BC Managed Care – PPO | Admitting: Family Medicine

## 2020-06-09 ENCOUNTER — Encounter: Payer: Self-pay | Admitting: Family Medicine

## 2020-06-09 VITALS — Ht 66.0 in | Wt 221.0 lb

## 2020-06-09 DIAGNOSIS — H8303 Labyrinthitis, bilateral: Secondary | ICD-10-CM

## 2020-06-09 DIAGNOSIS — G7000 Myasthenia gravis without (acute) exacerbation: Secondary | ICD-10-CM | POA: Diagnosis not present

## 2020-06-09 NOTE — Telephone Encounter (Signed)
Done

## 2020-06-09 NOTE — Progress Notes (Signed)
Established Patient Office Visit  Subjective:  Patient ID: [name], female    DOB: [birth year]  Age: 59 y.o. MRN: [id number]  CC:  Chief Complaint  Patient presents with  . Advice Only    patient needing FLMA form filled out     HPI [name] presents for discussion of her myasthenia gravis and how FMLA form should be completed.  She is still seeking care with a neurologist who specializes in this condition.  Her company is meeting specifics on how she may be limited in her jobs duties.  She works from home.  Of course she is working on a computer throughout the day.  She deals with up to 2 bouts of diplopia that may be also associated with vertigo in the morning and afternoon.  What seems to work best for her is to take a 5 to 10-minute break away from her computer and walk around.  Meclizine is been helping her dizziness but it does make her drowsy.  Is difficult for her to take it during the day while she is at work  Past Medical History:  Diagnosis Date  . Allergy   . Anemia    on med - caltrate  . Anxiety   . Blood transfusion    09/2010  . Chronic low back pain 09/21/2019  . Enlarged heart    no problems per patient  . GERD (gastroesophageal reflux disease)    diet -  otc med prn  . Headache(784.0)    tx with otc ibuprofen  . Hypertension    on meds  . Migraine headache 09/21/2019  . Myasthenia gravis    rare - no current problems - if flares just affects eyes -see double- now entire body 05-12-2020  . Pre-diabetes   . Shortness of breath    with exertion  . Vertigo 09/21/2019    Past Surgical History:  Procedure Laterality Date  . APPENDECTOMY     age 20 yrs  . CESAREAN SECTION     2000 - FTP  . MYOMECTOMY     abd myomectomy 1990's  . robotic supracervical hysterectomy  04/2011  . WISDOM TOOTH EXTRACTION      Family History  Problem Relation Age of Onset  . Heart failure Father 59  . Hypertension Father   . Lung cancer Brother 66  . Heart  disease Brother 5       CAD/CHF  . Cancer Daughter 12       lumphomia   . Hypertension Mother   . Diabetes Mother   . Thyroid nodules Mother   . Heart disease Mother 74       CHF  . Colon polyps Mother   . Cancer Brother        Lung cancer  . Anesthesia problems Neg Hx   . Hypotension Neg Hx   . Malignant hyperthermia Neg Hx   . Pseudochol deficiency Neg Hx   . Crohn's disease Neg Hx   . Esophageal cancer Neg Hx   . Rectal cancer Neg Hx   . Stomach cancer Neg Hx     Social History   Socioeconomic History  . Marital status: Single    Spouse name: Not on file  . Number of children: Not on file  . Years of education: Not on file  . Highest education level: Not on file  Occupational History  . Not on file  Tobacco Use  . Smoking status: Former Smoker  Quit date: 10/14/1980    Years since quitting: 39.6  . Smokeless tobacco: Never Used  Vaping Use  . Vaping Use: Never used  Substance and Sexual Activity  . Alcohol use: Yes    Comment: occasionally - 1-2 month - wine  . Drug use: No  . Sexual activity: Never    Birth control/protection: Surgical    Comment: hyst  Other Topics Concern  . Not on file  Social History Narrative   Marital status: single      Children: 1 daugther.      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 Difficulty of Paying Living Expenses: Not on file  Food Insecurity:   . Worried About Charity fundraiser in the Last Year: Not on file  . Ran Out of Food in the Last Year: Not on file  Transportation Needs:   . Lack of Transportation (Medical): Not on file  . Lack of Transportation (Non-Medical): Not on file  Physical Activity:   . Days of Exercise per Week: Not on file  . Minutes of Exercise per Session: Not on file  Stress:   . Feeling of Stress : Not on  file  Social Connections:   . Frequency of Communication with Friends and Family: Not on file  . Frequency of Social Gatherings with Friends and Family: Not on file  . Attends Religious Services: Not on file  . Active Member of Clubs or Organizations: Not on file  . Attends Archivist Meetings: Not on file  . Marital Status: Not on file  Intimate Partner Violence:   . Fear of Current or Ex-Partner: Not on file  . Emotionally Abused: Not on file  . Physically Abused: Not on file  . Sexually Abused: Not on file    Outpatient Medications Prior to Visit  Medication Sig Dispense Refill  . ferrous sulfate 325 (65 FE) MG tablet Take 1 tablet (325 mg total) by mouth 2 (two) times daily with a meal. 30 tablet 2  . lisinopril-hydrochlorothiazide (ZESTORETIC) 20-25 MG tablet Take 1 tablet by mouth daily. 90 tablet 1  . meclizine (ANTIVERT) 12.5 MG tablet Take 1 tablet (12.5 mg total) by mouth 3 (three) times daily as needed for dizziness. 40 tablet 1  . metoprolol succinate (TOPROL-XL) 25 MG 24 hr tablet Take 1 tablet (25 mg total) by mouth daily. 90 tablet 1  . multivitamin (THERAGRAN) per tablet Take 1 tablet by mouth daily.      . rizatriptan (MAXALT) 10 MG tablet Take 1 tablet (10 mg total) by mouth 3 (three) times daily as needed for migraine. 10 tablet 2  . rizatriptan (MAXALT) 10 MG tablet Take 1 tablet (10 mg total) by mouth as needed for migraine. May repeat in 2 hours if needed 10 tablet 0  . topiramate (TOPAMAX) 25 MG tablet Take one tablet at night for one week, then take 2 tablets at night for one week, then take 3 tablets at night. 90 tablet 3  . Vitamin D, Ergocalciferol, (DRISDOL) 1.25 MG (50000 UNIT) CAPS capsule Take 1 capsule (50,000 Units total) by mouth every 7 (seven) days. 30 capsule 0   No facility-administered medications prior to visit.    Allergies  Allergen Reactions  . Codeine Nausea And Vomiting and Other (See Comments)    Dizziness,  rooom spends  .  Etodolac Nausea Only  . Hydrocodone-Acetaminophen Nausea And Vomiting    ROS Review of Systems  Constitutional: Negative.   Eyes: Positive for visual disturbance.  Respiratory: Negative.   Cardiovascular: Negative.   Gastrointestinal: Negative.   Neurological: Positive for dizziness. Negative for seizures and speech difficulty.  Psychiatric/Behavioral: Negative.       Objective:    Physical Exam Vitals and nursing note reviewed.  Constitutional:      Appearance: Normal appearance.  HENT:     Head: Normocephalic and atraumatic.     Right Ear: External ear normal.     Left Ear: External ear normal.  Eyes:     General: No scleral icterus.       Right eye: No discharge.        Left eye: No discharge.     Conjunctiva/sclera: Conjunctivae normal.  Pulmonary:     Effort: Pulmonary effort is normal.  Neurological:     Mental Status: She is alert and oriented to person, place, and time.  Psychiatric:        Mood and Affect: Mood normal.        Behavior: Behavior normal.     Ht 5\' 6"  (1.676 m)   Wt 221 lb (100.2 kg)   LMP 01/04/2011   BMI 35.67 kg/m  Wt Readings from Last 3 Encounters:  06/09/20 221 lb (100.2 kg)  05/26/20 221 lb (100.2 kg)  05/25/20 225 lb (102.1 kg)     Health Maintenance Due  Topic Date Due  . COVID-19 Vaccine (1) Never done  . TETANUS/TDAP  Never done  . PAP SMEAR-Modifier  08/07/2015  . INFLUENZA VACCINE  05/14/2020    There are no preventive care reminders to display for this patient.  Lab Results  Component Value Date   TSH 2.98 03/30/2020   Lab Results  Component Value Date   WBC 4.9 03/30/2020   HGB 11.7 (L) 03/30/2020   HCT 36.0 03/30/2020   MCV 81.1 03/30/2020   PLT 317.0 03/30/2020   Lab Results  Component Value Date   NA 139 03/30/2020   K 4.0 03/30/2020   CO2 29 03/30/2020   GLUCOSE 91 03/30/2020   BUN 14 03/30/2020   CREATININE 0.81 03/30/2020   BILITOT 0.3 03/30/2020   ALKPHOS 110 03/30/2020   AST 16  03/30/2020   ALT 13 03/30/2020   PROT 7.1 03/30/2020   ALBUMIN 4.2 03/30/2020   CALCIUM 9.8 03/30/2020   ANIONGAP 9 08/13/2019   GFR 87.70 03/30/2020   Lab Results  Component Value Date   CHOL 146 05/13/2015   Lab Results  Component Value Date   HDL 48 05/13/2015   Lab Results  Component Value Date   LDLCALC 85 05/13/2015   Lab Results  Component Value Date   TRIG 63 05/13/2015   Lab Results  Component Value Date   CHOLHDL 3.0 05/13/2015   Lab Results  Component Value Date   HGBA1C 6.5 03/30/2020      Assessment & Plan:   Problem List Items Addressed This Visit      Nervous and Auditory   Myasthenia gravis ([place]) - Primary   Labyrinthitis of both ears      No orders of the defined types were placed in this encounter.   Follow-up: No follow-ups on file.  Completed the FMLA form for her.  Asked for up to  210-minute breaks in the morning and again in the afternoon for bouts of diplopia and/or labyrinthitis.  Suggested that meclizine may cause less drowsiness the more it is taken.  She is awaiting follow-up with neurology.  Libby Maw, MD   Virtual Visit via Video Note  I connected with Dana Palmer [place]f on 06/09/20 at  2:30 PM EDT by a video enabled telemedicine application and verified that I am speaking with the correct person using two identifiers.  Location: Patient: home alone in her office.  Provider:    I discussed the limitations of evaluation and management by telemedicine and the availability of in person appointments. The patient expressed understanding and agreed to proceed.  History of Present Illness:    Observations/Objective:   Assessment and Plan:   Follow Up Instructions:    I discussed the assessment and treatment plan with the patient. The patient was provided an opportunity to ask questions and all were answered. The patient agreed with the plan and demonstrated an understanding of the instructions.   The patient was  advised to call back or seek an in-person evaluation if the symptoms worsen or if the condition fails to improve as anticipated.  I provided 20 minutes of non-face-to-face time during this encounter.   Libby Maw, MD

## 2020-06-12 NOTE — Telephone Encounter (Signed)
Patient aware that form ready for pick up ?

## 2020-06-12 NOTE — Telephone Encounter (Signed)
Patient is calling and is requesting a copy of completed FMLA paperwork and would like a cal back when forms are ready for pick up, please advise. CB is [phone].

## 2020-06-20 ENCOUNTER — Telehealth: Payer: Self-pay | Admitting: Neurology

## 2020-06-20 NOTE — Telephone Encounter (Signed)
Pt called wanting to speak to someone in billing about a $75 payment she had made for her MRI that she states was to be transferred to GI. Pt refused to leave VM. Please advise.

## 2020-06-21 ENCOUNTER — Other Ambulatory Visit: Payer: Self-pay

## 2020-06-21 ENCOUNTER — Telehealth: Payer: Self-pay | Admitting: Neurology

## 2020-06-21 ENCOUNTER — Other Ambulatory Visit: Payer: BC Managed Care – PPO

## 2020-06-21 ENCOUNTER — Ambulatory Visit
Admission: RE | Admit: 2020-06-21 | Discharge: 2020-06-21 | Disposition: A | Discharge status: Home / Self Care | Payer: BC Managed Care – PPO | Source: Ambulatory Visit | Attending: Diagnostic Neuroimaging | Admitting: Diagnostic Neuroimaging

## 2020-06-21 DIAGNOSIS — H814 Vertigo of central origin: Secondary | ICD-10-CM

## 2020-06-21 NOTE — Telephone Encounter (Signed)
Pt is asking that GI be notified that her MRI has to be without contrast due to an allergic reaction she had last time, please call.

## 2020-06-21 NOTE — Telephone Encounter (Signed)
Noted, thank you

## 2020-06-21 NOTE — Addendum Note (Signed)
Addended by: Florian Buff C on: 06/21/2020 11:00 AM   Modules accepted: Orders

## 2020-06-21 NOTE — Telephone Encounter (Signed)
I left a voicemail informing the patient the order has been switched to without contrast.

## 2020-06-22 ENCOUNTER — Telehealth: Payer: Self-pay | Admitting: Neurology

## 2020-06-22 NOTE — Telephone Encounter (Signed)
Pt called faxing FMLA forms for Dr. Jannifer Franklin  to fill out. Employer need forms returned by September 14. Have faxed FMLA forms to [facility] [employer]; [employer] Blades. Dr. Jannifer Franklin only has to answer two questions, Dr. Oneita Kras has completed the form. Would like a call from the nurse.

## 2020-06-22 NOTE — Telephone Encounter (Signed)
I spoke with the patient. She stated the two questions on the form is the length of time needed for appts and the frequency of appts she would need to see Dr Jannifer Franklin. Pt stated the FMLA is for intermittent leave for her migraines and vertigo. She also stated she has MG and needs mestinon and has been trying to get in with Dr Jannifer Franklin. She will be seeing Judson Roch for migraine/vertigo f/u on 9/20. I let pt know that [employer] Blades is currently out of the office as well as Dr Jannifer Franklin but a message will be sent to them to look into this on Monday. Pt is concerned about the deadline so she is going to try to reach back out to Dr Ethelene Hal as he has filled it out twice but they needed the additional info.

## 2020-06-23 ENCOUNTER — Telehealth: Payer: Self-pay | Admitting: Family Medicine

## 2020-06-23 NOTE — Telephone Encounter (Signed)
Patient dropped off forms to be completed by her provider. She asked that I put them in a sealed envelope to hand to provider. She also said that she needs them completed by Tuesday. Placed in providers folder up front.

## 2020-06-26 NOTE — Telephone Encounter (Signed)
The FMLA form was filled out and signed.

## 2020-06-27 ENCOUNTER — Encounter: Payer: Self-pay | Admitting: *Deleted

## 2020-06-27 ENCOUNTER — Telehealth: Payer: Self-pay | Admitting: *Deleted

## 2020-06-27 NOTE — Telephone Encounter (Signed)
Pt fmla form faxed.

## 2020-06-27 NOTE — Telephone Encounter (Signed)
Patient called to check the status of her forms. She states that they have to be submitted today. She would like them faxed to [phone], but she wants the nurse to call her before faxing so she can let her know exactly what to fax. She can be reached at [phone].

## 2020-06-28 NOTE — Telephone Encounter (Signed)
Called patient to inform that forms were faxed and they are ready for her to pick up. No answer LM

## 2020-07-03 ENCOUNTER — Ambulatory Visit: Payer: BC Managed Care – PPO | Admitting: Family Medicine

## 2020-07-03 ENCOUNTER — Ambulatory Visit: Payer: BC Managed Care – PPO | Admitting: Neurology

## 2020-07-03 ENCOUNTER — Encounter: Payer: Self-pay | Admitting: Neurology

## 2020-07-03 VITALS — BP 161/85 | HR 71 | Ht 66.0 in | Wt 224.4 lb

## 2020-07-03 DIAGNOSIS — G7000 Myasthenia gravis without (acute) exacerbation: Secondary | ICD-10-CM | POA: Diagnosis not present

## 2020-07-03 DIAGNOSIS — G43909 Migraine, unspecified, not intractable, without status migrainosus: Secondary | ICD-10-CM

## 2020-07-03 NOTE — Patient Instructions (Signed)
Please call me today with your previous medications We will make adjustments accordingly

## 2020-07-03 NOTE — Progress Notes (Signed)
PATIENT: [name] DOB: 1961-02-05  REASON FOR VISIT: follow up HISTORY FROM: patient  HISTORY OF PRESENT ILLNESS: Today 07/03/20 [name] is a 59 year old female with history of vertigo and migraine headache.  MRI of the brain was unremarkable without contrast.  Is on Topamax 75 mg at bedtime, Maxalt as needed.  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  Apparently has continued with double vision, but was able to tilt her head or use magnifying glass to cope.  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  Has been taking meclizine for dizziness/nausea with benefit.  Migraines have improved on Topamax.  No trouble swallowing or with limb weakness.  At her worst with myasthenia, she was out of work on disability, had slurred speech, diplopia, ptosis, extremity weakness.  Think she was on Mestinon, and something that started with a "p", possibly prednisone?  Requesting to restart Mestinon, but needed continuous release?  Is nervous, anxious, does not want blood work today.  Presents today unaccompanied.  HISTORY 09/21/2019 Dr. Jannifer Franklin: [name] is a 59 year old right-handed black female with a history of double vision since the 1990s, she was followed by Dr. Marijean Bravo from neurology previously was told she had ocular myasthenia gravis.  She has been off of all disease modifying agents for quite a number of years, she was last seen through a neurologist in 2008.  She has a history of intermittent headaches, she may have 1 or 2 bad headaches a month but she has several more minor headaches a week.  Her headaches may be brought on by bright lights.   The patient over the last year has noted some problems with vertigo associated with spinning sensation that may come and go, she is not clearly related to her headache.  The patient may have vertigo that may last up to 5 minutes, she will have to remain still and let the vertigo pass.  She may have vertigo getting up out of bed in the morning, she had this on 13 August 2019, this was associated with significant nausea and vomiting.  The patient went to the emergency room and had a CT scan of the brain that was unremarkable.  She reports some numbness in the hands and feet, she has had carpal tunnel surgery on the right, and was told she had it on the left but did not have surgery.  She reports no weakness to the extremities.  She reports significant low back pain with standing, she cannot stand for more than 5 minutes at a time.  She denies problems controlling the bowels or the bladder, she does have some gait instability when she feels dizzy, she may fall on occasion.  She is sent to this office for an evaluation.   REVIEW OF SYSTEMS: Out of a complete 14 system review of symptoms, the patient complains only of the following symptoms, and all other reviewed systems are negative.  Double vision  ALLERGIES: Allergies  Allergen Reactions   Codeine Nausea And Vomiting and Other (See Comments)    Dizziness,  rooom spends   Etodolac Nausea Only   Hydrocodone-Acetaminophen Nausea And Vomiting  HOME MEDICATIONS: Outpatient Medications Prior to Visit  Medication Sig Dispense Refill   ferrous sulfate 325 (65 FE) MG tablet Take 1 tablet (325 mg total) by mouth 2 (two) times daily with a meal. 30 tablet 2   lisinopril-hydrochlorothiazide (ZESTORETIC) 20-25 MG tablet Take 1 tablet by mouth daily. 90 tablet 1   meclizine (ANTIVERT) 12.5 MG tablet Take 1 tablet (12.5 mg total) by mouth 3 (three) times daily as needed for dizziness. 40 tablet 1   metoprolol succinate (TOPROL-XL) 25 MG 24 hr tablet  Take 1 tablet (25 mg total) by mouth daily. 90 tablet 1   multivitamin (THERAGRAN) per tablet Take 1 tablet by mouth daily.       rizatriptan (MAXALT) 10 MG tablet Take 1 tablet (10 mg total) by mouth 3 (three) times daily as needed for migraine. 10 tablet 2   rizatriptan (MAXALT) 10 MG tablet Take 1 tablet (10 mg total) by mouth as needed for migraine. May repeat in 2 hours if needed 10 tablet 0   topiramate (TOPAMAX) 25 MG tablet Take one tablet at night for one week, then take 2 tablets at night for one week, then take 3 tablets at night. 90 tablet 3   Vitamin D, Ergocalciferol, (DRISDOL) 1.25 MG (50000 UNIT) CAPS capsule Take 1 capsule (50,000 Units total) by mouth every 7 (seven) days. 30 capsule 0   No facility-administered medications prior to visit.    PAST MEDICAL HISTORY: Past Medical History:  Diagnosis Date   Allergy    Anemia    on med - caltrate   Anxiety    Blood transfusion    09/2010   Chronic low back pain 09/21/2019   Enlarged heart    no problems per patient   GERD (gastroesophageal reflux disease)    diet -  otc med prn   Headache(784.0)    tx with otc ibuprofen   Hypertension    on meds   Migraine headache 09/21/2019   Myasthenia gravis    rare - no current problems - if flares just affects eyes -see double- now entire body 05-12-2020   Pre-diabetes    Shortness of breath    with exertion   Vertigo 09/21/2019    PAST SURGICAL HISTORY: Past Surgical History:  Procedure Laterality Date   APPENDECTOMY     age 16 yrs   CESAREAN SECTION     2000 - FTP   MYOMECTOMY     abd myomectomy 1990's   robotic supracervical hysterectomy  04/2011   WISDOM TOOTH EXTRACTION      FAMILY HISTORY: Family History  Problem Relation Age of Onset   Heart failure Father 52   Hypertension Father    Lung cancer Brother 9   Heart disease Brother 16       CAD/CHF   Cancer Daughter 66       lumphomia    Hypertension Mother    Diabetes  Mother    Thyroid nodules Mother    Heart disease Mother 71       CHF   Colon polyps Mother    Cancer Brother        Lung cancer   Anesthesia problems Neg Hx    Hypotension Neg Hx    Malignant hyperthermia Neg Hx    Pseudochol deficiency Neg Hx    Crohn's disease Neg Hx    Esophageal cancer Neg Hx    Rectal cancer Neg Hx    Stomach cancer Neg Hx  SOCIAL HISTORY: Social History   Socioeconomic History   Marital status: Single    Spouse name: Not on file   Number of children: Not on file   Years of education: Not on file   Highest education level: Not on file  Occupational History   Not on file  Tobacco Use   Smoking status: Former Smoker    Quit date: 10/14/1980    Years since quitting: 39.7   Smokeless tobacco: Never Used  Vaping Use   Vaping Use: Never used  Substance and Sexual Activity   Alcohol use: Yes    Comment: occasionally - 1-2 month - wine   Drug use: No   Sexual activity: Never    Birth control/protection: Surgical    Comment: hyst  Other Topics Concern   Not on file  Social History Narrative   Marital status: single      Children: 1 daugther.      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      Tobacco: none      Alcohol:  None      Exercise: none   Right handed    Social Determinants of Health   Financial Resource Strain:    Difficulty of Paying Living Expenses: Not on file  Food Insecurity:    Worried About [place] in the Last Year: Not on file   [employer] of Food in the Last Year: Not on file  Transportation Needs:    Lack of Transportation (Medical): Not on file   Lack of Transportation (Non-Medical): Not on file  Physical Activity:    Days of Exercise per Week: Not on file   Minutes of Exercise per Session: Not on file  Stress:    Feeling of Stress : Not on file  Social Connections:    Frequency of Communication with Friends and Family: Not on file     Frequency of Social Gatherings with Friends and Family: Not on file   Attends Religious Services: Not on file   Active Member of Clubs or Organizations: Not on file   Attends Archivist Meetings: Not on file   Marital Status: Not on file  Intimate Partner Violence:    Fear of Current or Ex-Partner: Not on file   Emotionally Abused: Not on file   Physically Abused: Not on file   Sexually Abused: Not on file   PHYSICAL EXAM  Vitals:   07/03/20 1342  BP: (!) 161/85  Pulse: 71  Weight: 224 lb 6.4 oz (101.8 kg)  Height: 5\' 6"  (1.676 m)   Body mass index is 36.22 kg/m.  Generalized: Well developed, in no acute distress   Neurological examination  Mentation: Alert oriented to time, place, history taking. Follows all commands speech and language fluent Cranial nerve II-XII: Pupils were equal round reactive to light. Extraocular movements were full, visual field were full on confrontational test. Facial sensation and strength were normal.  Head turning and shoulder shrug  were normal and symmetric.  No cheek puff or eye open weakness noted.  With superior gaze, subjective diplopia was noted after about 10 seconds. Motor: The motor testing reveals 5 over 5 strength of all 4 extremities. Good symmetric motor tone is noted throughout.  Sensory: Sensory testing is intact to soft touch on all 4 extremities. No evidence of extinction is noted.  Coordination: Cerebellar testing reveals good finger-nose-finger and heel-to-shin bilaterally.  Gait and station: Gait is  normal. Tandem gait is normal. Romberg is negative. No drift is seen.  Reflexes: Deep tendon reflexes are symmetric and normal bilaterally.   DIAGNOSTIC DATA (LABS, IMAGING, TESTING) - I reviewed patient records, labs, notes, testing and imaging myself where available.  Lab Results  Component Value Date   WBC 4.9 03/30/2020   HGB 11.7 (L) 03/30/2020   HCT 36.0 03/30/2020   MCV 81.1 03/30/2020   PLT 317.0  03/30/2020      Component Value Date/Time   NA 139 03/30/2020 1516   K 4.0 03/30/2020 1516   CL 105 03/30/2020 1516   CO2 29 03/30/2020 1516   GLUCOSE 91 03/30/2020 1516   BUN 14 03/30/2020 1516   CREATININE 0.81 03/30/2020 1516   CREATININE 0.96 08/21/2015 1546   CALCIUM 9.8 03/30/2020 1516   PROT 7.1 03/30/2020 1516   ALBUMIN 4.2 03/30/2020 1516   AST 16 03/30/2020 1516   ALT 13 03/30/2020 1516   ALKPHOS 110 03/30/2020 1516   BILITOT 0.3 03/30/2020 1516   GFRNONAA >60 08/13/2019 1601   GFRAA >60 08/13/2019 1601   Lab Results  Component Value Date   CHOL 146 05/13/2015   HDL 48 05/13/2015   LDLCALC 85 05/13/2015   LDLDIRECT 112.0 03/30/2020   TRIG 63 05/13/2015   CHOLHDL 3.0 05/13/2015   Lab Results  Component Value Date   HGBA1C 6.5 03/30/2020   Lab Results  Component Value Date   VAPOLIDC30 131 03/30/2020   Lab Results  Component Value Date   TSH 2.98 03/30/2020   ASSESSMENT AND PLAN 59 y.o. year old female  has a past medical history of Allergy, Anemia, Anxiety, Blood transfusion, Chronic low back pain (09/21/2019), Enlarged heart, GERD (gastroesophageal reflux disease), Headache(784.0), Hypertension, Migraine headache (09/21/2019), Myasthenia gravis, Pre-diabetes, Shortness of breath, and Vertigo (09/21/2019). here with:  1.  History of migraine headache 2.  Vertigo, intermittent 3.  History of ocular myasthenia  -Consider exacerbation of MG symptoms, but chronic diplopia, over the last month triggered diplopia more with new job requiring screen time -Consider reinitiation of Mestinon, but patient isn't sure dosing, reportedly required ER preparation, unclear if on another immunosuppressant, she tossed out her bottles, she will call her previous neurologist  -Considered checking acetylcholine enzyme levels, but patient very reluctant for blood work currently -Will await patient message, and send accordingly, she really wants to make sure we get back on the same  regimen since it worked -Continue Topamax, Maxalt for migraine headache -Follow-up in 3 months or sooner if needed  I spent 30 minutes of face-to-face and non-face-to-face time with patient.  This included previsit chart review, lab review, study review, order entry, electronic health record documentation, patient education.  Butler Denmark, AGNP-C, DNP 07/03/2020, 2:17 PM [facility] [address] [place], [place] [address] [phone]

## 2020-07-04 ENCOUNTER — Other Ambulatory Visit: Payer: Self-pay | Admitting: Neurology

## 2020-07-04 ENCOUNTER — Encounter: Payer: Self-pay | Admitting: Neurology

## 2020-07-04 MED ORDER — PYRIDOSTIGMINE BROMIDE 60 MG PO TABS
30.0000 mg | ORAL_TABLET | Freq: Three times a day (TID) | ORAL | 5 refills | Status: DC
Start: 2020-07-04 — End: 2020-07-13

## 2020-07-04 NOTE — Progress Notes (Signed)
I sent Mestinon 30 mg 3 times daily.

## 2020-07-04 NOTE — Progress Notes (Signed)
I have read the note, and I agree with the clinical assessment and plan.  [name] K [name]   

## 2020-07-13 ENCOUNTER — Telehealth: Payer: Self-pay | Admitting: Neurology

## 2020-07-13 DIAGNOSIS — G7000 Myasthenia gravis without (acute) exacerbation: Secondary | ICD-10-CM

## 2020-07-13 MED ORDER — PYRIDOSTIGMINE BROMIDE 60 MG PO TABS: 60.0000 mg | ORAL_TABLET | Freq: Three times a day (TID) | ORAL | 5 refills | Status: DC

## 2020-07-13 NOTE — Telephone Encounter (Signed)
I called the patient.  We are having a difficult time getting her records from her previous neurologist, he has retired.  She has started Mestinon 30 mg 3 times a day, really no change to the double vision.  We will increase to 60 mg 3 times a day, I will check acetylcholine binding enzyme (I put future order for lab collect to see if can be done at PCP).  I will have her see an ophthalmologist, be evaluated for prisms (she has someone to see).  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

## 2020-09-21 ENCOUNTER — Telehealth: Payer: Self-pay | Admitting: Neurology

## 2020-09-21 ENCOUNTER — Ambulatory Visit: Payer: BC Managed Care – PPO | Admitting: Neurology

## 2020-09-21 ENCOUNTER — Encounter: Payer: Self-pay | Admitting: Neurology

## 2020-09-21 VITALS — BP 128/80 | HR 87 | Ht 66.0 in | Wt 220.6 lb

## 2020-09-21 DIAGNOSIS — G43909 Migraine, unspecified, not intractable, without status migrainosus: Secondary | ICD-10-CM

## 2020-09-21 DIAGNOSIS — G7000 Myasthenia gravis without (acute) exacerbation: Secondary | ICD-10-CM | POA: Diagnosis not present

## 2020-09-21 MED ORDER — RIZATRIPTAN BENZOATE 10 MG PO TABS: 10.0000 mg | ORAL_TABLET | ORAL | 3 refills | Status: DC | PRN

## 2020-09-21 MED ORDER — PYRIDOSTIGMINE BROMIDE 60 MG PO TABS: 60.0000 mg | ORAL_TABLET | Freq: Three times a day (TID) | ORAL | 5 refills | Status: DC

## 2020-09-21 NOTE — Patient Instructions (Signed)
Continue Mestinon at current dosing Check lab today  Stop Topamax, for acute headache take Maxalt See you back in 6 months

## 2020-09-21 NOTE — Addendum Note (Signed)
Addended by: Inis Sizer D on: 09/21/2020 09:01 AM   Modules accepted: Orders

## 2020-09-21 NOTE — Progress Notes (Signed)
I have read the note, and I agree with the clinical assessment and plan.  Luke Falero K Anabia Weatherwax   

## 2020-09-21 NOTE — Telephone Encounter (Signed)
PT says that the MRI scheduled here 10/06/19 did not happen. She showed up and was unable to do the test because the truck was too small for her and she had a panic attack. She said she left and the person who was going to do the MRI said that he would make sure she got back her $75. She said she ended up having her MRI at [facility]. She says that the $75 from [place] came out of her bank account on 11/16/19 and she never received a refund. She asked if we could please look into this and give her a call.

## 2020-09-21 NOTE — Progress Notes (Signed)
PATIENT: [name] DOB: Jan 06, 1961  REASON FOR VISIT: follow up HISTORY FROM: patient  HISTORY OF PRESENT ILLNESS: Today 09/21/20 [name] is a 59 year old female with history of vertigo and migraine headache.  MRI of the brain was unremarkable without contrast.  Is on Topamax and Maxalt as needed.  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  We started Mestinon, ordered acetylcholine binding enzyme, was going to see her ophthalmologist for evaluation of prisms (hasn't seen yet).  Doing well on Mestinon 60 mg 3 times a day, double vision is 80% better.  No longer has a sensation of ptosis.  Still at the end of the day, may have eyestrain.  No muscle weakness, or trouble chewing, or swallowing. Has asked work for a Photographer.  Is tolerating Mestinon fairly well, does take after a meal.  Migraines on average 1-2 a month.  She has been taking Topamax on an as-needed basis, has yet to take Maxalt.  Presents today for evaluation unaccompanied.  HISTORY 07/03/2020 SS: [name] is a 59 year old female with history of vertigo and migraine headache.  MRI of the brain was unremarkable without contrast.  Is on Topamax 75 mg at bedtime, Maxalt as needed.  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  Apparently has continued with double vision, but was able to tilt her head or use magnifying glass to cope.  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  Has been taking meclizine for dizziness/nausea with benefit.  Migraines have improved on Topamax.  No trouble swallowing or with limb weakness.  At her worst with myasthenia, she was out of work on disability, had slurred speech, diplopia, ptosis, extremity weakness.  Think she was on Mestinon, and something that started with a "p", possibly prednisone?  Requesting to restart Mestinon, but needed continuous release?  Is nervous, anxious, does not want blood work today.  Presents today unaccompanied.   REVIEW OF SYSTEMS: Out of a complete 14 system review of symptoms, the patient complains only of the following symptoms, and all other reviewed systems are negative.  Double vision, headache  ALLERGIES: Allergies  Allergen Reactions  . Codeine Nausea And Vomiting and Other (See Comments)    Dizziness,  rooom spends  . Etodolac Nausea Only  . Hydrocodone-Acetaminophen Nausea And Vomiting    HOME MEDICATIONS: Outpatient Medications Prior to Visit  Medication Sig Dispense Refill  . ferrous sulfate 325 (65 FE) MG tablet Take 1 tablet (325 mg total) by mouth 2 (two) times daily with a meal. 30 tablet 2  . lisinopril-hydrochlorothiazide (ZESTORETIC) 20-25 MG tablet Take 1 tablet by mouth daily. 90 tablet 1  . meclizine (ANTIVERT) 12.5 MG tablet Take 1 tablet (12.5 mg total) by mouth 3 (three) times daily as needed for dizziness. 40 tablet 1  . metoprolol succinate (TOPROL-XL) 25 MG 24 hr tablet Take 1 tablet (25 mg total) by mouth daily. 90 tablet 1  .  multivitamin (THERAGRAN) per tablet Take 1 tablet by mouth daily.      . rizatriptan (MAXALT) 10 MG tablet Take 1 tablet (10 mg total) by mouth 3 (three) times daily as needed for migraine. 10 tablet 2  . Vitamin D, Ergocalciferol, (DRISDOL) 1.25 MG (50000 UNIT) CAPS capsule Take 1 capsule (50,000 Units total) by mouth every 7 (seven) days. 30 capsule 0  . pyridostigmine (MESTINON) 60 MG tablet Take 1 tablet (60 mg  total) by mouth 3 (three) times daily. 90 tablet 5  . rizatriptan (MAXALT) 10 MG tablet Take 1 tablet (10 mg total) by mouth as needed for migraine. May repeat in 2 hours if needed 10 tablet 0  . topiramate (TOPAMAX) 25 MG tablet Take one tablet at night for one week, then take 2 tablets at night for one week, then take 3 tablets at night. 90 tablet 3   No facility-administered medications prior to visit.    PAST MEDICAL HISTORY: Past Medical History:  Diagnosis Date  . Allergy   . Anemia    on med - caltrate  . Anxiety   . Blood transfusion    09/2010  . Chronic low back pain 09/21/2019  . Enlarged heart    no problems per patient  . GERD (gastroesophageal reflux disease)    diet -  otc med prn  . Headache(784.0)    tx with otc ibuprofen  . Hypertension    on meds  . Migraine headache 09/21/2019  . Myasthenia gravis    rare - no current problems - if flares just affects eyes -see double- now entire body 05-12-2020  . Pre-diabetes   . Shortness of breath    with exertion  . Vertigo 09/21/2019    PAST SURGICAL HISTORY: Past Surgical History:  Procedure Laterality Date  . APPENDECTOMY     age 35 yrs  . CESAREAN SECTION     2000 - FTP  . MYOMECTOMY     abd myomectomy 1990's  . robotic supracervical hysterectomy  04/2011  . WISDOM TOOTH EXTRACTION      FAMILY HISTORY: Family History  Problem Relation Age of Onset  . Heart failure Father 84  . Hypertension Father   . Lung cancer Brother 1  . Heart disease Brother 56       CAD/CHF  . Cancer Daughter 12       lumphomia   . Hypertension Mother   . Diabetes Mother   . Thyroid nodules Mother   . Heart disease Mother 43       CHF  . Colon polyps Mother   . Cancer Brother        Lung cancer  . Anesthesia problems Neg Hx   . Hypotension Neg Hx   . Malignant hyperthermia Neg Hx   . Pseudochol deficiency Neg Hx   . Crohn's disease Neg Hx   . Esophageal cancer Neg Hx   . Rectal cancer Neg Hx   . Stomach cancer Neg  Hx     SOCIAL HISTORY: Social History   Socioeconomic History  . Marital status: Single    Spouse name: Not on file  . Number of children: Not on file  . Years of education: Not on file  . Highest education level: Not on file  Occupational History  . Not on file  Tobacco Use  . Smoking status: Former Smoker    Quit date: 10/14/1980    Years since quitting: 39.9  . Smokeless tobacco: Never Used  Vaping  Use  . Vaping Use: Never used  Substance and Sexual Activity  . Alcohol use: Yes    Comment: occasionally - 1-2 month - wine  . Drug use: No  . Sexual activity: Never    Birth control/protection: Surgical    Comment: hyst  Other Topics Concern  . Not on file  Social History Narrative   Marital status: single      Children: 1 daugther.      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  Generalized: Well developed, in no acute distress   Neurological examination  Mentation: Alert oriented to time, place, history taking. Follows all commands speech and language fluent Cranial nerve II-XII: Pupils were equal round reactive to light. Extraocular movements were full, visual field were full on confrontational test. Facial sensation and strength were normal. Head turning and shoulder shrug  were normal and symmetric.  With superior gaze for 1 minute, no subjective diplopia, or ptosis noted.  No significant eye open weakness. Motor: The motor  testing reveals 5 over 5 strength of all 4 extremities. Good symmetric motor tone is noted throughout.  Sensory: Sensory testing is intact to soft touch on all 4 extremities. No evidence of extinction is noted.  Coordination: Cerebellar testing reveals good finger-nose-finger and heel-to-shin bilaterally.  Gait and station: Gait is normal. Tandem gait is slightly unsteady. Romberg is negative. No drift is seen.  Reflexes: Deep tendon reflexes are symmetric and normal bilaterally.   DIAGNOSTIC DATA (LABS, IMAGING, TESTING) - I reviewed patient records, labs, notes, testing and imaging myself where available.  Lab Results  Component Value Date   WBC 4.9 03/30/2020   HGB 11.7 (L) 03/30/2020   HCT 36.0 03/30/2020   MCV 81.1 03/30/2020   PLT 317.0 03/30/2020      Component Value Date/Time   NA 139 03/30/2020 1516   K 4.0 03/30/2020 1516   CL 105 03/30/2020 1516   CO2 29 03/30/2020 1516   GLUCOSE 91 03/30/2020 1516   BUN 14 03/30/2020 1516   CREATININE 0.81 03/30/2020 1516   CREATININE 0.96 08/21/2015 1546   CALCIUM 9.8 03/30/2020 1516   PROT 7.1 03/30/2020 1516   ALBUMIN 4.2 03/30/2020 1516   AST 16 03/30/2020 1516   ALT 13 03/30/2020 1516   ALKPHOS 110 03/30/2020 1516   BILITOT 0.3 03/30/2020 1516   GFRNONAA >60 08/13/2019 1601   GFRAA >60 08/13/2019 1601   Lab Results  Component Value Date   CHOL 146 05/13/2015   HDL 48 05/13/2015   LDLCALC 85 05/13/2015   LDLDIRECT 112.0 03/30/2020   TRIG 63 05/13/2015   CHOLHDL 3.0 05/13/2015   Lab Results  Component Value Date   HGBA1C 6.5 03/30/2020   Lab Results  Component Value Date   BMWUXLKG40 102 03/30/2020  Lab Results  Component Value Date   TSH 2.98 03/30/2020   ASSESSMENT AND PLAN 59 y.o. year old female  has a past medical history of Allergy, Anemia, Anxiety, Blood transfusion, Chronic low back pain (09/21/2019), Enlarged heart, GERD (gastroesophageal reflux disease), Headache(784.0), Hypertension, Migraine  headache (09/21/2019), Myasthenia gravis, Pre-diabetes, Shortness of breath, and Vertigo (09/21/2019). here with:  1.  History of migraine headache 2.  Vertigo -On average, 1-2 migraines a month -Taking Topamax on as-needed basis, significant side effect -Stop Topamax, has side effects, not taking daily -Treat acute headaches with Maxalt -If headache frequency increases, she is to let me know, we will consider another preventative medication, is already on metoprolol  3.  History of ocular myasthenia -80% better with addition of Mestinon for double vision -Continue Mestinon 60 mg 3 times a day -Check acetylcholine receptor binding today  -See ophthalmology for evaluation of prisms -Have been unable to obtain her records from previous neurologist (in a box somewhere) -Follow-up in 6 months or sooner if needed  I spent 30 minutes of face-to-face and non-face-to-face time with patient.  This included previsit chart review, lab review, study review, order entry, electronic health record documentation, patient education.  Butler Denmark, AGNP-C, DNP 09/21/2020, 8:51 AM [facility] [address], [place], [place] [address] [phone]

## 2020-09-22 LAB — ACETYLCHOLINE RECEPTOR, BINDING: AChR Binding Ab, Serum: 0.09 nmol/L (ref 0.00–0.24)

## 2020-10-12 ENCOUNTER — Telehealth: Payer: Self-pay

## 2020-10-12 NOTE — Telephone Encounter (Signed)
Pt states she had coffee spilled on her left side hand and legs. Pt states she has been putting ice on it to help with the pain and burning sensations but would like to know is there anything otc she could use to help with this. Pt states it did not cause any blisters or bruising but there is swelling. Please advise.

## 2020-10-12 NOTE — Telephone Encounter (Signed)
Unable to leave voicemail, will call again before end of day.

## 2020-10-17 ENCOUNTER — Ambulatory Visit: Payer: BC Managed Care – PPO | Admitting: Neurology

## 2020-10-18 NOTE — Telephone Encounter (Signed)
Called to check on patient but was unable to reach her. LVM informing patient to return our call.

## 2020-11-09 ENCOUNTER — Telehealth: Payer: Self-pay | Admitting: Neurology

## 2020-11-09 NOTE — Telephone Encounter (Signed)
Pt called, need NP to write me a letter to excuse me from jury duty.  Would like a call from the nurse.

## 2020-11-09 NOTE — Telephone Encounter (Signed)
I called pt and LMVM for her that per SS/NP last visit was doing ok, not seeing reason to excuse, please call back to discuss.

## 2020-11-09 NOTE — Telephone Encounter (Signed)
I don't think an excuse for jury duty is indicated at this time based on her last visit. 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

## 2020-11-10 ENCOUNTER — Encounter: Payer: Self-pay | Admitting: Neurology

## 2020-11-13 NOTE — Telephone Encounter (Signed)
Pt. states that additional information can be given from Dr. Bebe Shaggy office.

## 2020-11-13 NOTE — Telephone Encounter (Signed)
Pt. called back & asked for information to be sent thru my chart. She states she is at work & can't keep playing phone tag.

## 2020-11-13 NOTE — Telephone Encounter (Signed)
LMVM for pt to return call re: jury duty and MG.

## 2020-11-28 ENCOUNTER — Encounter: Payer: Self-pay | Admitting: *Deleted

## 2020-12-04 IMAGING — CT CT HEAD W/O CM
3 series · 14 of 47 positions shown, 16 images · non-contrast
Comparison: 07/06/2007

CLINICAL DATA: Increasing dizziness for the past 3 days. History of
vertigo. Near syncopal episode. Vomiting.

EXAM:
CT HEAD WITHOUT CONTRAST
TECHNIQUE: Contiguous axial images were obtained from the base of the skull
through the vertex without intravenous contrast.

[Series 2: head wo · axial · 0.49mm/px · z∈[-104,+20]mm · 8 of 31 slices shown, 10 images]
[im 3/31  brain]
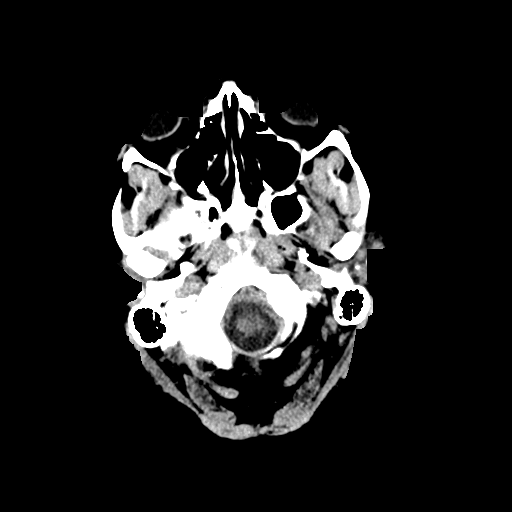
[im 3/31  bone]
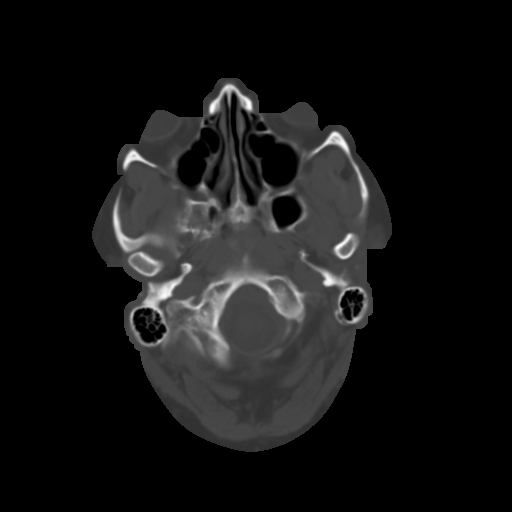
[im 7/31  brain]
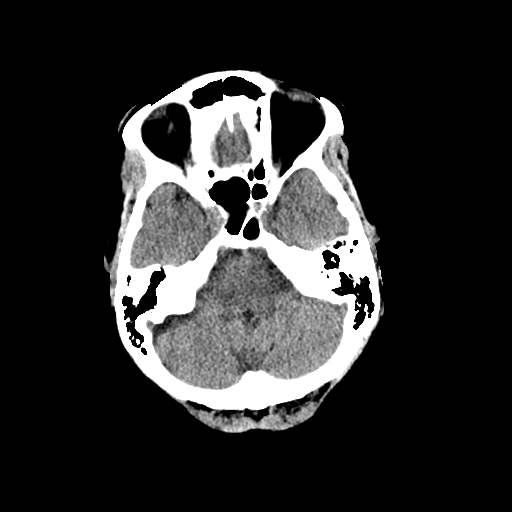
[im 10/31  brain]
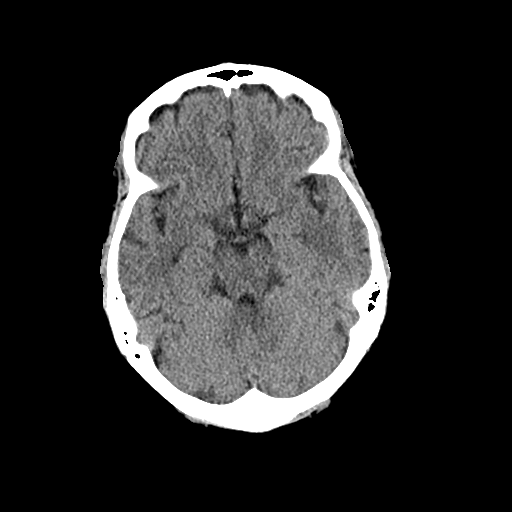
[im 14/31  brain]
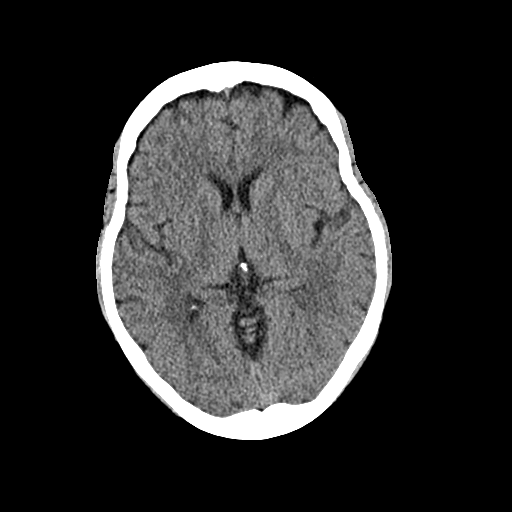
[im 17/31  brain]
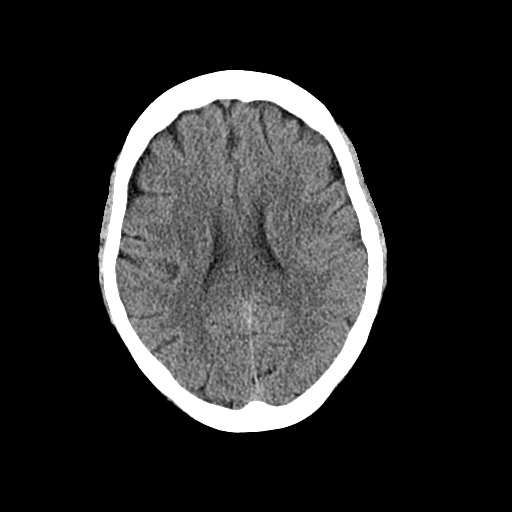
[im 17/31  bone]
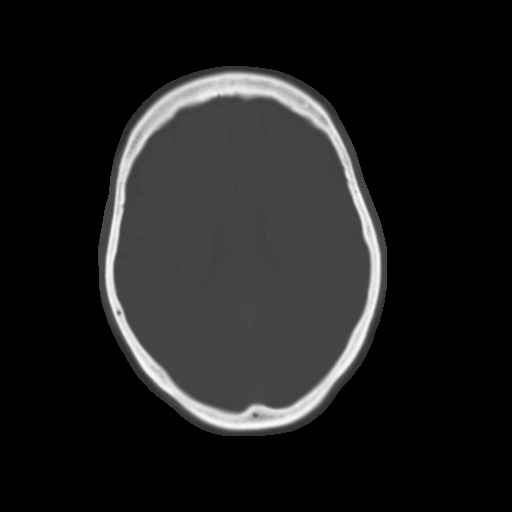
[im 21/31  brain]
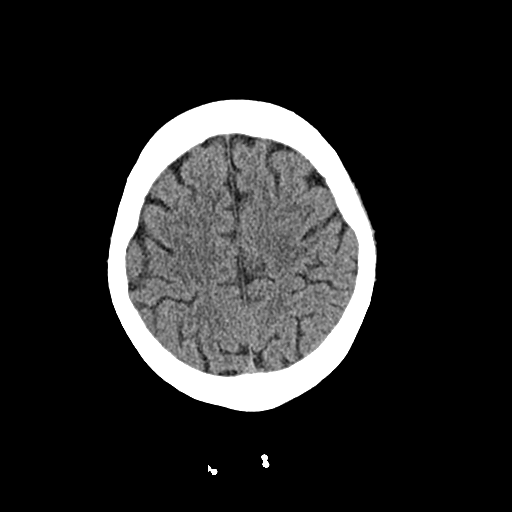
[im 24/31  brain]
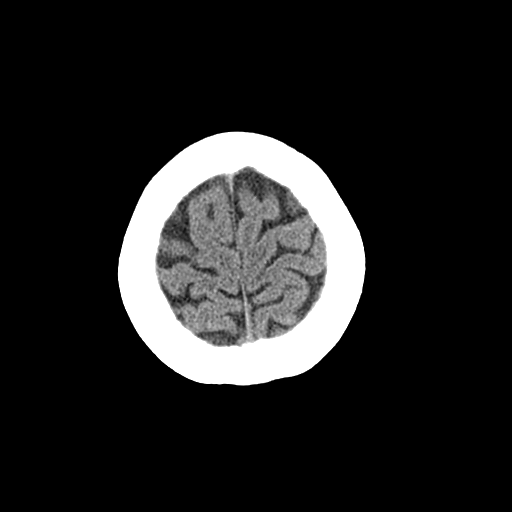
[im 28/31  brain]
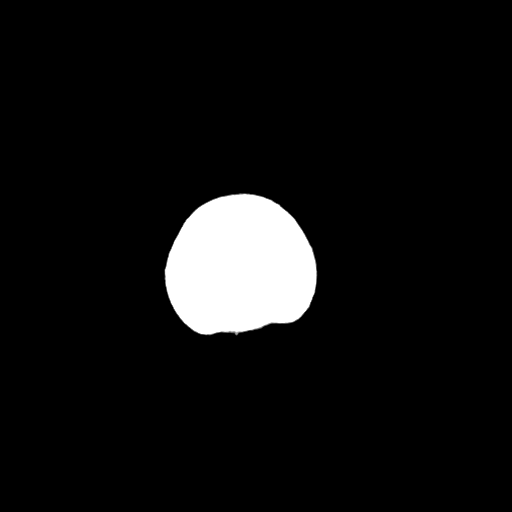

[Series 4: cor soft · coronal · 0.30mm/px · 3 of 79 slices shown]
[im 27/79  brain]
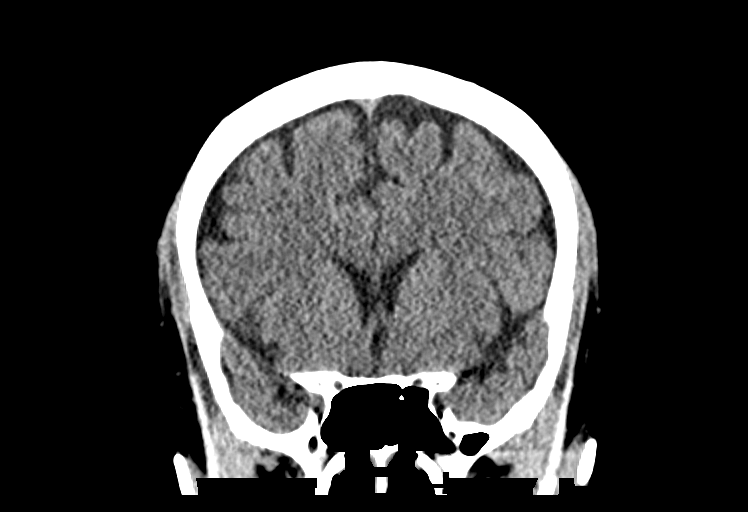
[im 35/79  brain]
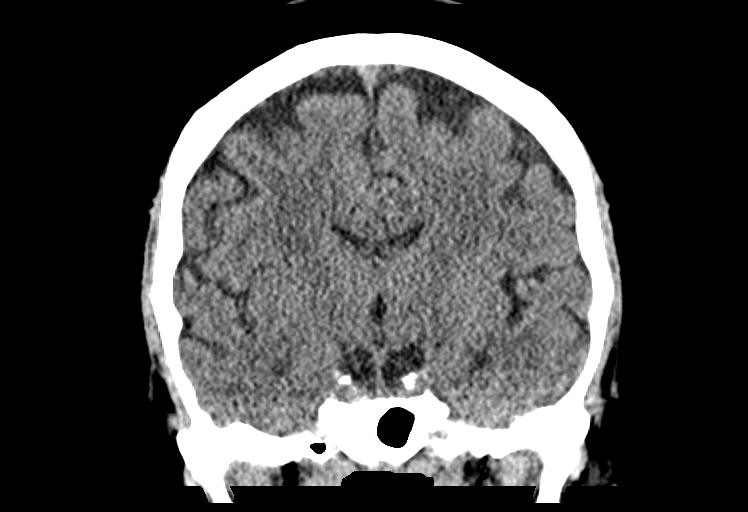
[im 44/79  brain]
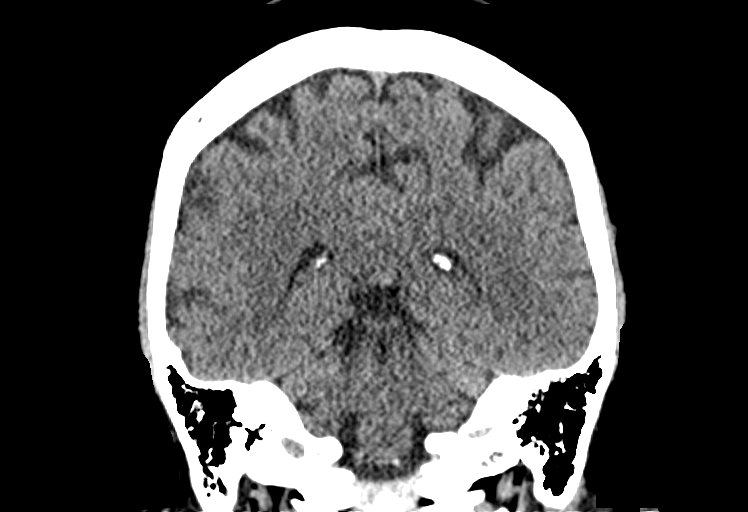

[Series 5: sag soft · sagittal · 0.30mm/px · 3 of 75 slices shown]
[im 25/75  brain]
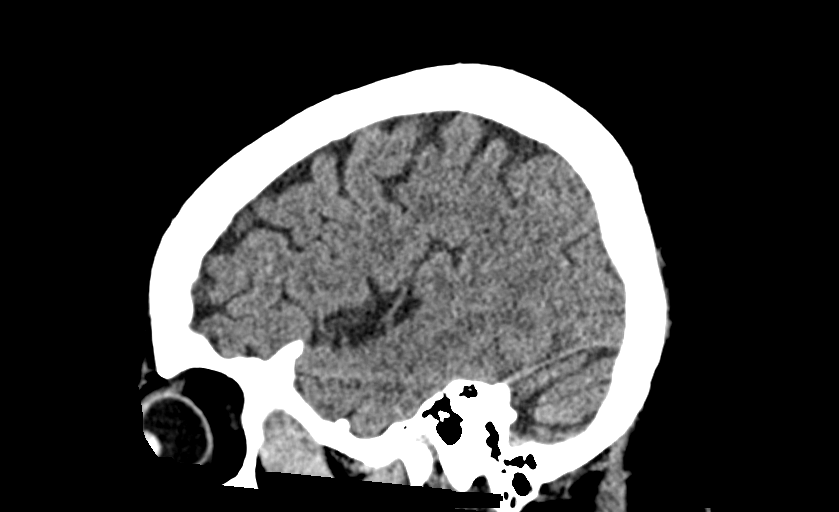
[im 38/75  brain]
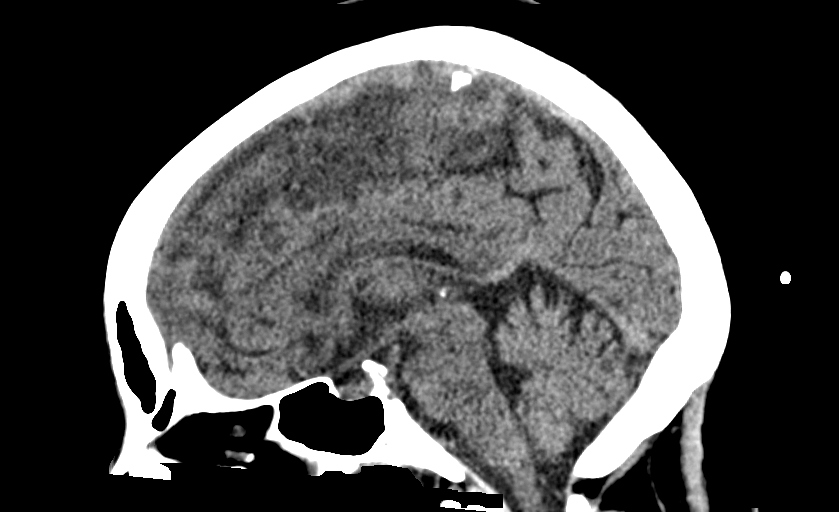
[im 50/75  brain]
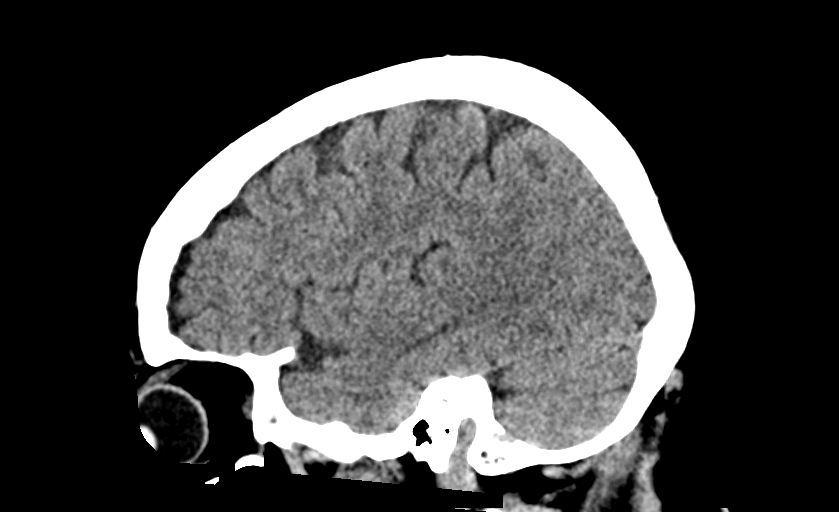

[14 of 47 positions shown; findings below may reference images not displayed]

FINDINGS: Brain: Normal appearing cerebral hemispheres and posterior fossa
structures. Normal size and position of the ventricles. No
intracranial hemorrhage, mass lesion or CT evidence of acute
infarction.

Vascular: No hyperdense vessel or unexpected calcification.

Skull: Normal. Negative for fracture or focal lesion.

Sinuses/Orbits: Unremarkable.

Other: None.
IMPRESSION: Normal examination.

## 2021-01-30 ENCOUNTER — Telehealth: Payer: Self-pay

## 2021-01-30 ENCOUNTER — Other Ambulatory Visit: Payer: Self-pay

## 2021-01-30 MED ORDER — LISINOPRIL-HYDROCHLOROTHIAZIDE 20-25 MG PO TABS: 1.0000 | ORAL_TABLET | Freq: Every day | ORAL | 1 refills | Status: DC

## 2021-01-30 MED ORDER — METOPROLOL SUCCINATE ER 25 MG PO TB24
25.0000 mg | ORAL_TABLET | Freq: Every day | ORAL | 1 refills | Status: DC
Start: 2021-01-30 — End: 2022-01-11

## 2021-01-30 NOTE — Telephone Encounter (Signed)
Pt requesting refills for;  lisinopril-hydrochlorothiazide (ZESTORETIC) 20-25 MG   metoprolol succinate (TOPROL-XL) 25 MG 24 hr    Pharmacy is [facility]

## 2021-01-30 NOTE — Telephone Encounter (Signed)
Refills sent in

## 2021-01-31 ENCOUNTER — Ambulatory Visit (INDEPENDENT_AMBULATORY_CARE_PROVIDER_SITE_OTHER): Payer: BC Managed Care – PPO

## 2021-01-31 ENCOUNTER — Ambulatory Visit: Payer: Self-pay

## 2021-01-31 ENCOUNTER — Other Ambulatory Visit: Payer: Self-pay

## 2021-01-31 ENCOUNTER — Ambulatory Visit: Payer: BC Managed Care – PPO | Admitting: Family Medicine

## 2021-01-31 VITALS — BP 144/92 | HR 73 | Ht 66.0 in | Wt 219.0 lb

## 2021-01-31 DIAGNOSIS — D1724 Benign lipomatous neoplasm of skin and subcutaneous tissue of left leg: Secondary | ICD-10-CM

## 2021-01-31 DIAGNOSIS — M25571 Pain in right ankle and joints of right foot: Secondary | ICD-10-CM | POA: Diagnosis not present

## 2021-01-31 DIAGNOSIS — M25572 Pain in left ankle and joints of left foot: Secondary | ICD-10-CM

## 2021-01-31 DIAGNOSIS — G8929 Other chronic pain: Secondary | ICD-10-CM

## 2021-01-31 NOTE — Progress Notes (Signed)
I, [name], LAT, ATC acting as a scribe for Dana Leader, MD.  Subjective:    CC: Bilateral ankle pain/swelling  HPI: Pt is a 60 y/o female c/o ankle pain and swelling ongoing for years. About 4 years ago, pt rolled L ankle and experienced severe pain radiating up through entire leg.  Pt locates pain to L posterior aspect around calcaneous and Achilles tendon. Pt notes she has a "knot" along the posterior aspect of L ankle. Pt locates R ankle pain to the lateral aspect that's been ongoing for 15+ years.   Ankle swelling: yes Aggravates: standing, walking Treatments tried: ankle brace  Pertinent review of Systems: No fevers or chills  Relevant historical information: Myasthenia gravis   Objective:    Vitals:   01/31/21 1424  BP: (!) 144/92  Pulse: 73  SpO2: 98%   General: Well Developed, well nourished, and in no acute distress.   MSK: Left ankle fullness medial posterior ankle near Achilles tendon.  Otherwise normal-appearing and nontender.  Normal ankle motion.  Right ankle swollen anterior lateral ankle with tenderness in this area. Tenderness is overlying syndesmosis. Stable ligamentous exam. Intact strength.  Pulses capillary fill and sensation intact distally.  Lab and Radiology Results  Diagnostic Limited MSK Ultrasound of: Left ankle Achilles tendon is intact and normal appearing Mass palpated and visible on exam is consistent with underlying subcutaneous fat.  This is typical for lipoma.  No vascular activity associated with presumed lipoma. Medial tendinous structures in this area normal-appearing Impression: Presumed lipoma  Diagnostic Limited MSK Ultrasound of: Right ankle No significant joint effusion at anterior lateral ankle.  However at syndesmosis patient has degenerative changes and joint effusion present. Impression: Degeneration and effusion at syndesmosis  X-Uehara images bilateral ankles obtained today personally and independently  interpreted  Right ankle: No fractures or severe malalignment.  Osteophyte present posterior calcaneus.  Left ankle: Normal-appearing ankle  Await formal radiology review  Impression and Recommendations:    Assessment and Plan: 60 y.o. female with left ankle masses consistent with lipoma on ultrasound and exam.  However ultrasound is not sufficiently diagnostic to fully evaluate for lipoma.  Plan for watchful waiting.  If the presumed lipoma is worsening or becoming obnoxious or painful next step would be MRI to further characterize lipoma and for potential surgical excision planning.  Right ankle pain and swelling.  Patient has irritation or degeneration at the syndesmosis.  Plan for physical therapy, compression sleeve, Voltaren gel, and exercises.  If not improved will consider injection.  Recheck in about a month.Marland Kitchen  PDMP not reviewed this encounter. Orders Placed This Encounter  Procedures  . [place] LIMITED JOINT SPACE STRUCTURES LOW BILAT(NO LINKED CHARGES)    Standing Status:   Future    Number of Occurrences:   1    Standing Expiration Date:   08/02/2021    Order Specific Question:   Reason for Exam (SYMPTOM  OR DIAGNOSIS REQUIRED)    Answer:   bilateral ankle pain    Order Specific Question:   Preferred imaging location?    Answer:   [facility]  . DG Ankle Complete Left    Standing Status:   Future    Number of Occurrences:   1    Standing Expiration Date:   01/31/2022    Order Specific Question:   Reason for Exam (SYMPTOM  OR DIAGNOSIS REQUIRED)    Answer:   chronic bilateral ankle pain    Order Specific Question:  Preferred imaging location?    Answer:   [facility]    Order Specific Question:   Is patient pregnant?    Answer:   No  . DG Ankle Complete Right    Standing Status:   Future    Number of Occurrences:   1    Standing Expiration Date:   01/31/2022    Order Specific Question:   Reason for Exam (SYMPTOM  OR DIAGNOSIS REQUIRED)     Answer:   chronic bilateral ankle pain    Order Specific Question:   Preferred imaging location?    Answer:   [facility]    Order Specific Question:   Is patient pregnant?    Answer:   No  . Ambulatory referral to Physical Therapy    Referral Priority:   Routine    Referral Type:   Physical Medicine    Referral Reason:   Specialty Services Required    Requested Specialty:   Physical Therapy   No orders of the defined types were placed in this encounter.   Discussed warning signs or symptoms. Please see discharge instructions. Patient expresses understanding.   The above documentation has been reviewed and is accurate and complete [name], M.D.

## 2021-01-31 NOTE — Patient Instructions (Addendum)
Thank you for coming in today.  I've referred you to Physical Therapy.  Let us know if you don't hear from them in one week.  Please get an Xray today before you leave  Please use voltaren gel up to 4x daily for pain as needed.   I recommend you obtained a compression sleeve to help with your joint problems. There are many options on the market however I recommend obtaining a Full ankle Body Helix compression sleeve.  You can find information (including how to appropriate measure yourself for sizing) can be found at www.Body http://www.lambert.com/.  Many of these products are health savings account (HSA) eligible.   You can use the compression sleeve at any time throughout the day but is most important to use while being active as well as for 2 hours post-activity.   It is appropriate to ice following activity with the compression sleeve in place.  Recheck in 6 weeks.     Lipoma  A lipoma is a noncancerous (benign) tumor that is made up of fat cells. This is a very common type of soft-tissue growth. Lipomas are usually found under the skin (subcutaneous). They may occur in any tissue of the body that contains fat. Common areas for lipomas to appear include the back, arms, shoulders, buttocks, and thighs. Lipomas grow slowly, and they are usually painless. Most lipomas do not cause problems and do not require treatment. What are the causes? The cause of this condition is not known. What increases the risk? You are more likely to develop this condition if:  You are 60-51 years old.  You have a family history of lipomas. What are the signs or symptoms? A lipoma usually appears as a small, round bump under the skin. In most cases, the lump will:  Feel soft or rubbery.  Not cause pain or other symptoms. However, if a lipoma is located in an area where it pushes on nerves, it can become painful or cause other symptoms. How is this diagnosed? A lipoma can usually be diagnosed with a physical exam.  You may also have tests to confirm the diagnosis and to rule out other conditions. Tests may include:  Imaging tests, such as a CT scan or an MRI.  Removal of a tissue sample to be looked at under a microscope (biopsy). How is this treated? Treatment for this condition depends on the size of the lipoma and whether it is causing any symptoms.  For small lipomas that are not causing problems, no treatment is needed.  If a lipoma is bigger or it causes problems, surgery may be done to remove the lipoma. Lipomas can also be removed to improve appearance. Most often, the procedure is done after applying a medicine that numbs the area (local anesthetic).  Liposuction may be done to reduce the size of the lipoma before it is removed through surgery, or it may be done to remove the lipoma. Lipomas are removed with this method in order to limit incision size and scarring. A liposuction tube is inserted through a small incision into the lipoma, and the contents of the lipoma are removed through the tube with suction. Follow these instructions at home:  Watch your lipoma for any changes.  Keep all follow-up visits as told by your health care provider. This is important. Contact a health care provider if:  Your lipoma becomes larger or hard.  Your lipoma becomes painful, red, or increasingly swollen. These could be signs of infection or a more serious  condition. Get help right away if:  You develop tingling or numbness in an area near the lipoma. This could indicate that the lipoma is causing nerve damage. Summary  A lipoma is a noncancerous tumor that is made up of fat cells.  Most lipomas do not cause problems and do not require treatment.  If a lipoma is bigger or it causes problems, surgery may be done to remove the lipoma.  Contact a health care provider if your lipoma becomes larger or hard, or if it becomes painful, red, or increasingly swollen. Pain, redness, and swelling could be signs  of infection or a more serious condition. This information is not intended to replace advice given to you by your health care provider. Make sure you discuss any questions you have with your health care provider. Document Revised: 05/17/2019 Document Reviewed: 05/17/2019 Elsevier Patient Education  [place].

## 2021-02-01 ENCOUNTER — Telehealth: Payer: Self-pay | Admitting: Family Medicine

## 2021-02-01 DIAGNOSIS — D1724 Benign lipomatous neoplasm of skin and subcutaneous tissue of left leg: Secondary | ICD-10-CM | POA: Insufficient documentation

## 2021-02-01 NOTE — Telephone Encounter (Signed)
Pt is calling our office stating Sherene Sires said he was going to put a referral in for PT at [id number]. I could not figure out what referral that would be. Please advise pt at [phone].

## 2021-02-02 NOTE — Progress Notes (Signed)
No fracture.  No significant arthritis.

## 2021-02-02 NOTE — Progress Notes (Signed)
No fracture seen.

## 2021-02-08 ENCOUNTER — Ambulatory Visit: Payer: BC Managed Care – PPO | Attending: Family Medicine | Admitting: Physical Therapy

## 2021-02-08 ENCOUNTER — Other Ambulatory Visit: Payer: Self-pay

## 2021-02-08 ENCOUNTER — Encounter: Payer: Self-pay | Admitting: Physical Therapy

## 2021-02-08 DIAGNOSIS — M25572 Pain in left ankle and joints of left foot: Secondary | ICD-10-CM | POA: Diagnosis not present

## 2021-02-08 DIAGNOSIS — R262 Difficulty in walking, not elsewhere classified: Secondary | ICD-10-CM | POA: Insufficient documentation

## 2021-02-08 DIAGNOSIS — R600 Localized edema: Secondary | ICD-10-CM | POA: Insufficient documentation

## 2021-02-08 DIAGNOSIS — M25571 Pain in right ankle and joints of right foot: Secondary | ICD-10-CM | POA: Diagnosis not present

## 2021-02-08 NOTE — Patient Instructions (Signed)
Access Code: [email or web address] URL: [email or web address] Date: 02/08/2021 Prepared by: Lum Babe  Exercises Gastroc Stretch on Wall - 2 x daily - 7 x weekly - 1 sets - 5 reps - 30 hold Standing Gastroc Stretch on Foam 1/2 Roll - 2 x daily - 7 x weekly - 1 sets - 5 reps - 30 hold

## 2021-02-08 NOTE — Therapy (Signed)
[place] [place]. [place], [place], [address] Phone: [phone]   Fax:  [phone]  Physical Therapy Evaluation  Patient Details  Name: [name] MRN: [id number] Date of Birth: July 22, 1961 Referring Provider (PT): Marikay Alar Date: 02/08/2021   PT End of Session - 02/08/21 1042    Visit Number 1    Number of Visits 60    Date for PT Re-Evaluation 04/10/21    Authorization Type BCBS    PT Start Time 0910    PT Stop Time 0950    PT Time Calculation (min) 40 min    Activity Tolerance Patient tolerated treatment well    Behavior During Therapy [facility] for tasks assessed/performed           Past Medical History:  Diagnosis Date  . Allergy   . Anemia    on med - caltrate  . Anxiety   . Blood transfusion    09/2010  . Chronic low back pain 09/21/2019  . Enlarged heart    no problems per patient  . GERD (gastroesophageal reflux disease)    diet -  otc med prn  . Headache(784.0)    tx with otc ibuprofen  . Hypertension    on meds  . Migraine headache 09/21/2019  . Myasthenia gravis    rare - no current problems - if flares just affects eyes -see double- now entire body 05-12-2020  . Pre-diabetes   . Shortness of breath    with exertion  . Vertigo 09/21/2019    Past Surgical History:  Procedure Laterality Date  . APPENDECTOMY     age 60 yrs  . CESAREAN SECTION     2000 - [facility]  . MYOMECTOMY     abd myomectomy 1990's  . robotic supracervical hysterectomy  04/2011  . WISDOM TOOTH EXTRACTION      There were no vitals filed for this visit.   Subjective Assessment - 02/08/21 0917    Subjective Patient reports that she has had injuries to the ankles 10 and 4 years ago, she reports that she has had injections in the past.  She reports that in the past 6 months she was cleaning out the garage and started noticing more and more pain and swelling in the ankles.  X-rays negative, MD feels that the swellin in  the left achilles in a lipoma.    Limitations Standing;Walking;House hold activities    How long can you stand comfortably? 15 minutes    How long can you walk comfortably? 15 minutes    Patient Stated Goals have less pain, do more without pain    Currently in Pain? Yes    Pain Score 5     Pain Location Ankle    Pain Orientation Right;Left    Pain Descriptors / Indicators Constant;Aching;Throbbing    Pain Type Acute pain    Pain Radiating Towards she does c/o some numbness in the 4th and 5th toes bilaterally    Pain Onset More than a month ago    Pain Frequency Constant    Aggravating Factors  standing, walking pain up to 8-9/10    Pain Relieving Factors compression wrap, rest at best 3/10    Effect of Pain on Daily Activities limits difficulty with ADL's and time on my feet and cleaning              [facility] PT Assessment - 02/08/21 0001      Assessment  Medical Diagnosis bilateral ankle pain    Referring Provider (PT) Georgina Snell    Onset Date/Surgical Date 01/08/21    Prior Therapy years ago      Precautions   Precautions None      Balance Screen   Has the patient fallen in the past 6 months Yes    How many times? 1    Has the patient had a decrease in activity level because of a fear of falling?  No    Is the patient reluctant to leave their home because of a fear of falling?  No      Home Environment   Additional Comments no stairs, does housework, some yardwork,      Prior Function   Level of Independence Independent    Vocation Full time employment    Vocation Requirements mostly sitting    Leisure no exercise      Observation/Other Assessments-Edema    Edema --   there is swelling bilatral malleoli, left medial achilles swelling     ROM / Strength   AROM / PROM / Strength AROM;Strength      AROM   AROM Assessment Site Ankle    Right/Left Ankle Right;Left    Right Ankle Dorsiflexion 9    Right Ankle Plantar Flexion 42    Right Ankle Inversion 22    Right  Ankle Eversion 10    Left Ankle Dorsiflexion 4    Left Ankle Plantar Flexion 42    Left Ankle Inversion 20    Left Ankle Eversion 10      Strength   Strength Assessment Site Ankle    Right/Left Ankle Right;Left    Right Ankle Dorsiflexion 4-/5    Right Ankle Plantar Flexion 4-/5    Right Ankle Inversion 4-/5    Right Ankle Eversion 4-/5    Left Ankle Dorsiflexion 4-/5    Left Ankle Plantar Flexion 4-/5    Left Ankle Inversion 4-/5    Left Ankle Eversion 4-/5      Palpation   Palpation comment has swelling bilateral lateral malleoli, some swelling left medial achilles, she is tender in these areas as well      Ambulation/Gait   Gait Comments no device, mild toe out, no limp                         [facility] Adult PT Treatment/Exercise - 02/08/21 0001      Modalities   Modalities Iontophoresis      Iontophoresis   Type of Iontophoresis Dexamethasone    Location left medial achilles    Dose 63mA dose    Time 4 hour patch                    PT Short Term Goals - 02/08/21 1102      PT SHORT TERM GOAL #1   Title indepednent with initial HEP    Time 2    Status New             PT Long Term Goals - 02/08/21 1102      PT LONG TERM GOAL #1   Title increase ROM to 10 degrees DF bilaterally    Time 8    Period Weeks    Status New      PT LONG TERM GOAL #2   Title increase bilateral ankle strength to 4+/5    Time 8    Period Weeks    Status  New      PT LONG TERM GOAL #3   Title decrease pain overall 50%    Time 8    Period Weeks    Status New      PT LONG TERM GOAL #4   Title tolerate standing or walking 30 minutes    Time 8    Period Weeks    Status New                 Plan - 02/08/21 1053    Clinical Impression Statement Patient referred for bilateral ankle pain.  She reports that the right ankle has been hurting for years after a lateral ankle sprain, the left one is hurting recently with something falling on it and her  being very active cleaning out her garage.  She has bilatral lateral malleoli swelling, she has medial left achilles swelling that the MD feels is a lipoma.  She has limited ROM mostly iwth DF, has decresaed strength for all motions., has tenderness in the lateral malleoli and the left achilles.    Clinical Decision Making Low    Rehab Potential Good    PT Frequency 1x / week    PT Duration 8 weeks    PT Treatment/Interventions ADLs/Self Care Home Management;Cryotherapy;Electrical Stimulation;Iontophoresis 4mg /ml Dexamethasone;Moist Heat;Ultrasound;Balance training;Therapeutic exercise;Therapeutic activities;Functional mobility training;Stair training;Patient/family education;Dry needling;Manual techniques    PT Next Visit Plan Patient reports worries about copay, she is going to call insurance, she elected to try PT everyother week    Consulted and Agree with Plan of Care Patient           Patient will benefit from skilled therapeutic intervention in order to improve the following deficits and impairments:  Decreased range of motion,Difficulty walking,Pain,Impaired flexibility,Increased edema,Decreased strength  Visit Diagnosis: Pain in right ankle and joints of right foot - Plan: PT plan of care cert/re-cert  Pain in left ankle and joints of left foot - Plan: PT plan of care cert/re-cert  Localized edema - Plan: PT plan of care cert/re-cert  Difficulty in walking, not elsewhere classified - Plan: PT plan of care cert/re-cert     Problem List Patient Active Problem List   Diagnosis Date Noted  . Lipoma of left lower extremity 02/01/2021  . Labyrinthitis of both ears 05/25/2020  . Iron deficiency 04/04/2020  . Vitamin D deficiency 04/04/2020  . Myasthenia gravis ([place]) 03/30/2020  . Anemia 03/30/2020  . Healthcare maintenance 03/30/2020  . Fatigue 03/30/2020  . Obesity (BMI 30-39.9) 03/30/2020  . Vertigo 09/21/2019  . Migraine without status migrainosus, not intractable  09/21/2019  . Chronic low back pain 09/21/2019  . Leiomyoma of uterus, unspecified 04/25/2011  . Essential hypertension 04/25/2011    Sumner Boast., PT 02/08/2021, 11:07 AM  [address] [place]. [place], [place], [address] Phone: [phone]   Fax:  [phone]  Name: [name] MRN: [id number] Date of Birth: 06-Dec-1960

## 2021-03-14 ENCOUNTER — Ambulatory Visit: Payer: BC Managed Care – PPO | Admitting: Family Medicine

## 2021-04-05 ENCOUNTER — Ambulatory Visit: Payer: BC Managed Care – PPO | Admitting: Neurology

## 2021-04-05 ENCOUNTER — Other Ambulatory Visit: Payer: Self-pay

## 2021-04-05 ENCOUNTER — Encounter: Payer: Self-pay | Admitting: Neurology

## 2021-04-05 VITALS — BP 170/84 | HR 78 | Ht 66.0 in | Wt 220.2 lb

## 2021-04-05 DIAGNOSIS — G43909 Migraine, unspecified, not intractable, without status migrainosus: Secondary | ICD-10-CM

## 2021-04-05 DIAGNOSIS — G7000 Myasthenia gravis without (acute) exacerbation: Secondary | ICD-10-CM | POA: Diagnosis not present

## 2021-04-05 MED ORDER — PYRIDOSTIGMINE BROMIDE 60 MG PO TABS: 60.0000 mg | ORAL_TABLET | Freq: Three times a day (TID) | ORAL | 3 refills | Status: DC

## 2021-04-05 NOTE — Progress Notes (Signed)
Reason for visit: Ocular myasthenia gravis, migraine headache  [name] is an 60 y.o. female  History of present illness:  [name] is a 60 year old right-handed black female with a history of ocular myasthenia gravis treated with Mestinon.  The patient has gained good improvement with the Mestinon taking 60 mg 3 times daily.  The patient has had problems with migraine, she was placed on Topamax but could not really tolerate the medication well as it caused cognitive clouding.  The patient however has had improvement in her headaches and vertigo, she only takes the Maxalt if needed, she is also on a beta-blocker currently.  The patient is noting some problems with arthritic pain in the hips off and on.  She overall is doing much better.  Past Medical History:  Diagnosis Date   Allergy    Anemia    on med - caltrate   Anxiety    Blood transfusion    09/2010   Chronic low back pain 09/21/2019   Enlarged heart    no problems per patient   GERD (gastroesophageal reflux disease)    diet -  otc med prn   Headache(784.0)    tx with otc ibuprofen   Hypertension    on meds   Migraine headache 09/21/2019   Myasthenia gravis    rare - no current problems - if flares just affects eyes -see double- now entire body 05-12-2020   Pre-diabetes    Shortness of breath    with exertion   Vertigo 09/21/2019    Past Surgical History:  Procedure Laterality Date   APPENDECTOMY     age 67 yrs   CESAREAN SECTION     2000 - [facility]   MYOMECTOMY     abd myomectomy 1990's   robotic supracervical hysterectomy  04/2011   WISDOM TOOTH EXTRACTION      Family History  Problem Relation Age of Onset   Heart failure Father 60   Hypertension Father    Lung cancer Brother 57   Heart disease Brother 70       CAD/CHF   Cancer Daughter 55       lumphomia    Hypertension Mother    Diabetes Mother    Thyroid nodules Mother    Heart disease Mother 27       CHF   Colon polyps Mother    Cancer Brother         Lung cancer   Anesthesia problems Neg Hx    Hypotension Neg Hx    Malignant hyperthermia Neg Hx    Pseudochol deficiency Neg Hx    Crohn's disease Neg Hx    Esophageal cancer Neg Hx    Rectal cancer Neg Hx    Stomach cancer Neg Hx     Social history:  reports that she quit smoking about 40 years ago. Her smoking use included cigarettes. She has never used smokeless tobacco. She reports current alcohol use. She reports that she does not use drugs.    Allergies  Allergen Reactions   Codeine Nausea And Vomiting and Other (See Comments)    Dizziness,  rooom spends   Etodolac Nausea Only   Hydrocodone-Acetaminophen Nausea And Vomiting    Medications:  Prior to Admission medications   Medication Sig Start Date End Date Taking? Authorizing Provider  ferrous sulfate 325 (65 FE) MG tablet Take 1 tablet (325 mg total) by mouth 2 (two) times daily with a meal. 05/25/20   Abelino Derrick  Delrae Alfred, MD  lisinopril-hydrochlorothiazide (ZESTORETIC) 20-25 MG tablet Take 1 tablet by mouth daily. 01/30/21   Libby Maw, MD  meclizine (ANTIVERT) 12.5 MG tablet Take 1 tablet (12.5 mg total) by mouth 3 (three) times daily as needed for dizziness. 05/25/20   Libby Maw, MD  metoprolol succinate (TOPROL-XL) 25 MG 24 hr tablet Take 1 tablet (25 mg total) by mouth daily. 01/30/21   Libby Maw, MD  multivitamin [facility]) per tablet Take 1 tablet by mouth daily.      [provider]  pyridostigmine (MESTINON) 60 MG tablet Take 1 tablet (60 mg total) by mouth 3 (three) times daily. 09/21/20   Suzzanne Cloud, NP  rizatriptan (MAXALT) 10 MG tablet Take 1 tablet (10 mg total) by mouth 3 (three) times daily as needed for migraine. 09/21/19   Kathrynn Ducking, MD  rizatriptan (MAXALT) 10 MG tablet Take 1 tablet (10 mg total) by mouth as needed for migraine. May repeat in 2 hours if needed 09/21/20   Suzzanne Cloud, NP  Vitamin D, Ergocalciferol, (DRISDOL) 1.25 MG (50000  UNIT) CAPS capsule Take 1 capsule (50,000 Units total) by mouth every 7 (seven) days. 05/25/20   Libby Maw, MD    ROS:  Out of a complete 14 system review of symptoms, the patient complains only of the following symptoms, and all other reviewed systems are negative.  Joint pains Occasional double vision Headache, vertigo  Height 5\' 6"  (1.676 m), weight 220 lb 3.2 oz (99.9 kg), last menstrual period 01/04/2011.  Physical Exam  General: The patient is alert and cooperative at the time of the examination.  The patient is moderately obese.  Skin: No significant peripheral edema is noted.   Neurologic Exam  Mental status: The patient is alert and oriented x 3 at the time of the examination. The patient has apparent normal recent and remote memory, with an apparently normal attention span and concentration ability.   Cranial nerves: Facial symmetry is present. Speech is normal, no aphasia or dysarthria is noted. Extraocular movements are full. Visual fields are full.  Motor: The patient has good strength in all 4 extremities.  Sensory examination: Soft touch sensation is symmetric on the face, arms, and legs.  Coordination: The patient has good finger-nose-finger and heel-to-shin bilaterally.  Gait and station: The patient has a normal gait. Tandem gait is normal. Romberg is negative. No drift is seen.  Reflexes: Deep tendon reflexes are symmetric.   Assessment/Plan:  1.  Ocular myasthenia gravis, seronegative  2.  Migraine headache, vertigo  The patient will take Maxalt if needed.  She is off of the Topamax.  She will be given a prescription for the Mestinon, she will follow-up here in 6 months.  She will call if she has any concerns.  She may follow-up in the future with Dr. Jaynee Eagles.  Jill Alexanders MD 04/05/2021 3:17 PM  [facility] [address] [place], [place] [address]  Phone [phone] Fax [phone]

## 2021-05-14 ENCOUNTER — Encounter: Payer: Self-pay | Admitting: Gastroenterology

## 2021-05-30 ENCOUNTER — Encounter: Payer: Self-pay | Admitting: Gastroenterology

## 2021-06-06 DIAGNOSIS — Z1151 Encounter for screening for human papillomavirus (HPV): Secondary | ICD-10-CM | POA: Diagnosis not present

## 2021-06-06 DIAGNOSIS — Z01411 Encounter for gynecological examination (general) (routine) with abnormal findings: Secondary | ICD-10-CM | POA: Diagnosis not present

## 2021-06-06 DIAGNOSIS — Z90710 Acquired absence of both cervix and uterus: Secondary | ICD-10-CM | POA: Diagnosis not present

## 2021-06-06 DIAGNOSIS — Z01419 Encounter for gynecological examination (general) (routine) without abnormal findings: Secondary | ICD-10-CM | POA: Diagnosis not present

## 2021-08-02 ENCOUNTER — Ambulatory Visit (AMBULATORY_SURGERY_CENTER): Payer: BC Managed Care – PPO | Admitting: *Deleted

## 2021-08-02 ENCOUNTER — Other Ambulatory Visit: Payer: Self-pay

## 2021-08-02 ENCOUNTER — Encounter: Payer: Self-pay | Admitting: Gastroenterology

## 2021-08-02 VITALS — Ht 66.0 in | Wt 220.0 lb

## 2021-08-02 DIAGNOSIS — Z86010 Personal history of colonic polyps: Secondary | ICD-10-CM

## 2021-08-02 MED ORDER — ONDANSETRON HCL 4 MG PO TABS: 4.0000 mg | ORAL_TABLET | ORAL | 0 refills | Status: DC

## 2021-08-02 MED ORDER — NA SULFATE-K SULFATE-MG SULF 17.5-3.13-1.6 GM/177ML PO SOLN: 1.0000 | ORAL | 0 refills | Status: DC

## 2021-08-02 NOTE — Progress Notes (Signed)
Patient's pre-visit was done today over the phone with the patient due to COVID-19 pandemic. Name,DOB and address verified. Patient denies any allergies to Eggs and Soy. Patient denies any problems with anesthesia/sedation. Patient is not taking any diet pills or blood thinners. No home Oxygen. Packet of Prep instructions mailed to patient including a copy of a consent form-pt is aware. Patient understands to call us back with any questions or concerns. Patient is aware of our care-partner policy and [id number] safety protocol.

## 2021-08-16 ENCOUNTER — Encounter: Payer: Self-pay | Admitting: Gastroenterology

## 2021-08-16 ENCOUNTER — Ambulatory Visit (AMBULATORY_SURGERY_CENTER): Payer: BC Managed Care – PPO | Admitting: Gastroenterology

## 2021-08-16 ENCOUNTER — Other Ambulatory Visit: Payer: Self-pay

## 2021-08-16 VITALS — BP 123/64 | HR 66 | Temp 97.5°F | Resp 10 | Ht 66.0 in | Wt 215.0 lb

## 2021-08-16 DIAGNOSIS — D120 Benign neoplasm of cecum: Secondary | ICD-10-CM

## 2021-08-16 DIAGNOSIS — D123 Benign neoplasm of transverse colon: Secondary | ICD-10-CM

## 2021-08-16 DIAGNOSIS — Z86010 Personal history of colonic polyps: Secondary | ICD-10-CM

## 2021-08-16 DIAGNOSIS — Z1211 Encounter for screening for malignant neoplasm of colon: Secondary | ICD-10-CM

## 2021-08-16 DIAGNOSIS — D122 Benign neoplasm of ascending colon: Secondary | ICD-10-CM

## 2021-08-16 MED ORDER — SODIUM CHLORIDE 0.9 % IV SOLN: 500.0000 mL | Freq: Once | INTRAVENOUS | Status: AC

## 2021-08-16 NOTE — Progress Notes (Signed)
[facility] History and Physical   Primary Care Physician:  Libby Maw, MD   Reason for Procedure:   History of advanced polyps  Plan:     colonoscopnone     HPI: [name] is a 60 y.o. female    Past Medical History:  Diagnosis Date   Allergy    Anemia    on med - caltrate   Anxiety    Blood transfusion    09/2010   Chronic low back pain 09/21/2019   Enlarged heart    no problems per patient   GERD (gastroesophageal reflux disease)    diet -  otc med prn   Headache(784.0)    tx with otc ibuprofen   Hypertension    on meds   Lymphoma ([place])    ankle   Migraine headache 09/21/2019   Myasthenia gravis    rare - no current problems - if flares just affects eyes -see double- now entire body 05-12-2020   Pre-diabetes    Shortness of breath    with exertion   Vertigo 09/21/2019    Past Surgical History:  Procedure Laterality Date   APPENDECTOMY     age 71 yrs   CESAREAN SECTION     2000 - [facility]     abd myomectomy 1990's   robotic supracervical hysterectomy  04/14/2011   WISDOM TOOTH EXTRACTION      Prior to Admission medications   Medication Sig Start Date End Date Taking? Authorizing Provider  lisinopril-hydrochlorothiazide (ZESTORETIC) 20-25 MG tablet Take 1 tablet by mouth daily. 01/30/21  Yes Libby Maw, MD  metoprolol succinate (TOPROL-XL) 25 MG 24 hr tablet Take 1 tablet (25 mg total) by mouth daily. 01/30/21  Yes Libby Maw, MD  multivitamin [facility]) per tablet Take 1 tablet by mouth daily.     Yes [provider]  ondansetron (ZOFRAN) 4 MG tablet Take 1 tablet (4 mg total) by mouth as directed. Take one Zofran pill 30-60 minutes before each colonoscopy prep dose 08/02/21  Yes Jackquline Denmark, MD  pyridostigmine (MESTINON) 60 MG tablet Take 1 tablet (60 mg total) by mouth 3 (three) times daily. 04/05/21  Yes Kathrynn Ducking, MD  ferrous sulfate 325 (65 FE) MG tablet  Take 1 tablet (325 mg total) by mouth 2 (two) times daily with a meal. 05/25/20   Libby Maw, MD  meclizine (ANTIVERT) 12.5 MG tablet Take 1 tablet (12.5 mg total) by mouth 3 (three) times daily as needed for dizziness. 05/25/20   Libby Maw, MD  rizatriptan (MAXALT) 10 MG tablet Take 1 tablet (10 mg total) by mouth as needed for migraine. May repeat in 2 hours if needed Patient not taking: Reported on 08/16/2021 09/21/20   Suzzanne Cloud, NP  Vitamin D, Ergocalciferol, (DRISDOL) 1.25 MG (50000 UNIT) CAPS capsule Take 1 capsule (50,000 Units total) by mouth every 7 (seven) days. 05/25/20   Libby Maw, MD    Current Outpatient Medications  Medication Sig Dispense Refill   lisinopril-hydrochlorothiazide (ZESTORETIC) 20-25 MG tablet Take 1 tablet by mouth daily. 90 tablet 1   metoprolol succinate (TOPROL-XL) 25 MG 24 hr tablet Take 1 tablet (25 mg total) by mouth daily. 90 tablet 1   multivitamin (THERAGRAN) per tablet Take 1 tablet by mouth daily.       ondansetron (ZOFRAN) 4 MG tablet Take 1 tablet (4 mg total) by mouth as directed. Take one Zofran pill  30-60 minutes before each colonoscopy prep dose 2 tablet 0   pyridostigmine (MESTINON) 60 MG tablet Take 1 tablet (60 mg total) by mouth 3 (three) times daily. 270 tablet 3   ferrous sulfate 325 (65 FE) MG tablet Take 1 tablet (325 mg total) by mouth 2 (two) times daily with a meal. 30 tablet 2   meclizine (ANTIVERT) 12.5 MG tablet Take 1 tablet (12.5 mg total) by mouth 3 (three) times daily as needed for dizziness. 40 tablet 1   rizatriptan (MAXALT) 10 MG tablet Take 1 tablet (10 mg total) by mouth as needed for migraine. May repeat in 2 hours if needed (Patient not taking: Reported on 08/16/2021) 10 tablet 3   Vitamin D, Ergocalciferol, (DRISDOL) 1.25 MG (50000 UNIT) CAPS capsule Take 1 capsule (50,000 Units total) by mouth every 7 (seven) days. 30 capsule 0   Current Facility-Administered Medications  Medication  Dose Route Frequency Provider Last Rate Last Admin   0.9 %  sodium chloride infusion  500 mL Intravenous Once Jackquline Denmark, MD        Allergies as of 08/16/2021 - Review Complete 08/16/2021  Allergen Reaction Noted   Codeine Nausea And Vomiting and Other (See Comments) 04/08/2011   Etodolac Nausea Only 09/21/2019   Hydrocodone-acetaminophen Nausea And Vomiting 09/21/2019    Family History  Problem Relation Age of Onset   Hypertension Mother    Diabetes Mother    Thyroid nodules Mother    Heart disease Mother 13       CHF   Colon polyps Mother    Heart failure Father 62   Hypertension Father    Lung cancer Brother 64   Heart disease Brother 58       CAD/CHF   Cancer Brother        Lung cancer   Cancer Daughter 12       lumphomia    Anesthesia problems Neg Hx    Hypotension Neg Hx    Malignant hyperthermia Neg Hx    Pseudochol deficiency Neg Hx    Crohn's disease Neg Hx    Esophageal cancer Neg Hx    Rectal cancer Neg Hx    Stomach cancer Neg Hx    Colon cancer Neg Hx     Social History   Socioeconomic History   Marital status: Single    Spouse name: Not on file   Number of children: Not on file   Years of education: Not on file   Highest education level: Not on file  Occupational History   Not on file  Tobacco Use   Smoking status: Former    Types: Cigarettes    Quit date: 10/14/1980    Years since quitting: 40.8   Smokeless tobacco: Never  Vaping Use   Vaping Use: Never used  Substance and Sexual Activity   Alcohol use: Yes    Comment: occasionally - 1-2 month - wine   Drug use: No   Sexual activity: Never    Birth control/protection: Surgical    Comment: hyst  Other Topics Concern   Not on file  Social History Narrative   Lives with daughter in own home   Right handed    Drinks 1-2 cups caffeine daily   Social Determinants of Health   Financial Resource Strain: Not on file  Food Insecurity: Not on file  Transportation Needs: Not on file   Physical Activity: Not on file  Stress: Not on file  Social Connections: Not on file  Intimate  Partner Violence: Not on file    Review of Systems: Positive for none All other review of systems negative except as mentioned in the HPI.  Physical Exam: Vital signs in last 24 hours: @VSRANGES @   General:   Alert,  Well-developed, well-nourished, pleasant and cooperative in NAD Lungs:  Clear throughout to auscultation.   Heart:  Regular rate and rhythm; no murmurs, clicks, rubs,  or gallops. Abdomen:  Soft, nontender and nondistended. Normal bowel sounds.   Neuro/Psych:  Alert and cooperative. Normal mood and affect. A and O x 3    No significant changes were identified.  The patient continues to be an appropriate candidate for the planned procedure and anesthesia.   Carmell Austria, MD. [facility] 08/16/2021 8:30 AM@

## 2021-08-16 NOTE — Patient Instructions (Signed)
YOU HAD AN ENDOSCOPIC PROCEDURE TODAY AT THE [facility]:   Refer to the procedure report that was given to you for any specific questions about what was found during the examination.  If the procedure report does not answer your questions, please call your gastroenterologist to clarify.  If you requested that your care partner not be given the details of your procedure findings, then the procedure report has been included in a sealed envelope for you to review at your convenience later.  YOU SHOULD EXPECT: Some feelings of bloating in the abdomen. Passage of more gas than usual.  Walking can help get rid of the air that was put into your GI tract during the procedure and reduce the bloating. If you had a lower endoscopy (such as a colonoscopy or flexible sigmoidoscopy) you may notice spotting of blood in your stool or on the toilet paper. If you underwent a bowel prep for your procedure, you may not have a normal bowel movement for a few days.  Please Note:  You might notice some irritation and congestion in your nose or some drainage.  This is from the oxygen used during your procedure.  There is no need for concern and it should clear up in a day or so.  SYMPTOMS TO REPORT IMMEDIATELY:  Following lower endoscopy (colonoscopy or flexible sigmoidoscopy):  Excessive amounts of blood in the stool  Significant tenderness or worsening of abdominal pains  Swelling of the abdomen that is new, acute  Fever of 100F or higher   For urgent or emergent issues, a gastroenterologist can be reached at any hour by calling [phone]. Do not use MyChart messaging for urgent concerns.    DIET:  We do recommend a small meal at first, but then you may proceed to your regular diet.  Drink plenty of fluids but you should avoid alcoholic beverages for 24 hours.  MEDICATIONS:  Continue present medications.  Please see handouts given to you by your recovery nurse.  Thank you for allowing us to  provide for your healthcare needs today.  ACTIVITY:  You should plan to take it easy for the rest of today and you should NOT DRIVE or use heavy machinery until tomorrow (because of the sedation medicines used during the test).    FOLLOW UP: Our staff will call the number listed on your records 48-72 hours following your procedure to check on you and address any questions or concerns that you may have regarding the information given to you following your procedure. If we do not reach you, we will leave a message.  We will attempt to reach you two times.  During this call, we will ask if you have developed any symptoms of COVID 19. If you develop any symptoms (ie: fever, flu-like symptoms, shortness of breath, cough etc.) before then, please call [phone].  If you test positive for Covid 19 in the 2 weeks post procedure, please call and report this information to us.    If any biopsies were taken you will be contacted by phone or by letter within the next 1-3 weeks.  Please call us at [phone] if you have not heard about the biopsies in 3 weeks.    SIGNATURES/CONFIDENTIALITY: You and/or your care partner have signed paperwork which will be entered into your electronic medical record.  These signatures attest to the fact that that the information above on your After Visit Summary has been reviewed and is understood.  Full responsibility of the   confidentiality of this discharge information lies with you and/or your care-partner.  

## 2021-08-16 NOTE — Progress Notes (Signed)
Called to room to assist during endoscopic procedure.  Patient ID and intended procedure confirmed with present staff. Received instructions for my participation in the procedure from the performing physician.  

## 2021-08-16 NOTE — Op Note (Signed)
[facility] Patient Name: [name] Procedure Date: 08/16/2021 8:29 AM MRN: [id number] Endoscopist: Jackquline Denmark , MD Age: 60 Referring MD:  Date of Birth: 1961/10/09 Gender: Female Account #: [id number] Procedure:                Colonoscopy Indications:              High risk colon cancer surveillance: Personal                            history of colonic polyps Medicines:                Monitored Anesthesia Care Procedure:                Pre-Anesthesia Assessment:                           - Prior to the procedure, a History and Physical                            was performed, and patient medications and                            allergies were reviewed. The patient's tolerance of                            previous anesthesia was also reviewed. The risks                            and benefits of the procedure and the sedation                            options and risks were discussed with the patient.                            All questions were answered, and informed consent                            was obtained. Prior Anticoagulants: The patient has                            taken no previous anticoagulant or antiplatelet                            agents. ASA Grade Assessment: II - A patient with                            mild systemic disease. After reviewing the risks                            and benefits, the patient was deemed in                            satisfactory condition to undergo the procedure.  After obtaining informed consent, the colonoscope                            was passed under direct vision. Throughout the                            procedure, the patient's blood pressure, pulse, and                            oxygen saturations were monitored continuously. The                            PCF-HQ190L Colonoscope was introduced through the                            anus and advanced to the the cecum,  identified by                            appendiceal orifice and ileocecal valve. The                            colonoscopy was performed without difficulty. The                            patient tolerated the procedure well. The quality                            of the bowel preparation was good. The ileocecal                            valve, appendiceal orifice, and rectum were                            photographed. Scope In: 8:36:25 AM Scope Out: 8:51:54 AM Scope Withdrawal Time: 0 hours 12 minutes 27 seconds  Total Procedure Duration: 0 hours 15 minutes 29 seconds  Findings:                 A 6 mm polyp was found in the proximal ascending                            colon. The polyp was sessile. The polyp was removed                            with a cold snare. Resection and retrieval were                            complete. A tattoo was noted in the proximal                            transverse colon. No residual or recurrent polyps                            in that area.  Non-bleeding internal hemorrhoids were found during                            retroflexion. The hemorrhoids were small and Grade                            I (internal hemorrhoids that do not prolapse).                           The exam was otherwise without abnormality on                            direct and retroflexion views. Complications:            No immediate complications. Estimated Blood Loss:     Estimated blood loss: none. Impression:               - One 6 mm polyp in the proximal ascending colon,                            removed with a cold snare. Resected and retrieved.                           - Non-bleeding internal hemorrhoids.                           - The examination was otherwise normal on direct                            and retroflexion views. Recommendation:           - Patient has a contact number available for                             emergencies. The signs and symptoms of potential                            delayed complications were discussed with the                            patient. Return to normal activities tomorrow.                            Written discharge instructions were provided to the                            patient.                           - Resume previous diet.                           - Continue present medications.                           - Await pathology results.                           -  Repeat colonoscopy for surveillance based on                            pathology results.                           - The findings and recommendations were discussed                            with the patient's family. Jackquline Denmark, MD 08/16/2021 8:56:52 AM This report has been signed electronically.

## 2021-08-16 NOTE — Progress Notes (Signed)
Pt's states no medical or surgical changes since previsit or office visit. VS assessed by C.W 

## 2021-08-16 NOTE — Progress Notes (Signed)
PT taken to PACU. Monitors in place. VSS. Report given to RN. 

## 2021-08-20 ENCOUNTER — Telehealth: Payer: Self-pay

## 2021-08-20 NOTE — Telephone Encounter (Signed)
Left message on follow up call. 

## 2021-08-20 NOTE — Telephone Encounter (Signed)
Attempted to reach patient for post-procedure f/u call. No answer. Left message for her to please not hesitate to call us if she has any questions/concerns regarding her care. 

## 2021-08-28 ENCOUNTER — Encounter: Payer: Self-pay | Admitting: Gastroenterology

## 2021-09-22 ENCOUNTER — Other Ambulatory Visit: Payer: Self-pay | Admitting: Family Medicine

## 2021-09-22 DIAGNOSIS — E611 Iron deficiency: Secondary | ICD-10-CM

## 2021-10-01 ENCOUNTER — Ambulatory Visit: Payer: BC Managed Care – PPO | Admitting: Neurology

## 2021-10-08 NOTE — Progress Notes (Signed)
Reason for visit: Ocular myasthenia gravis, migraine headache  [name] is an 60 y.o. female  10/09/2021: Patient transitioning to me from Dr. Jannifer Franklin with a hx of stable migraines and ocular myasthenia gravis. She has low energy level, she has low back pain, the mestinon works fr the ocular myasthenia gravis, since 1995. She had a sleep test at least 10 years ago, she has gained 20 pounds, discussed sleep apnea can cause fatigue, she declines a sleep test, discussed sequelae of untreated sleep apnea, she says it was a waste of time and declines. She has felt fatigued for years. She has low back pain and unclear why. She had an MRI low back. She has more aching and not weakness so not likely myasthenia gravis. Recommend Dr. Ethelene Hal a rheumatologic evaluation. Mom and brother have RF. Ongoing for year and more joint pain. Overall the mestinon 3x a day fixes her eye problems. Migraines episodic, maxalt helps acutely.   MRI brain normal 06/2020: normal personally reviewed images   03/2020: TSH, B!2 was normal.  09/21/2020: 0.09 AChR Binding Ab Vitamin D 25.57 (discussed she has Vi D def see Dr. Ethelene Hal)  Patient complains of symptoms per HPI as well as the following symptoms: fatigue . Pertinent negatives and positives per HPI. All others negative   History of present illness:  [name] is a 60 year old right-handed black female with a history of ocular myasthenia gravis treated with Mestinon.  The patient has gained good improvement with the Mestinon taking 60 mg 3 times daily.  The patient has had problems with migraine, she was placed on Topamax but could not really tolerate the medication well as it caused cognitive clouding.  The patient however has had improvement in her headaches and vertigo, she only takes the Maxalt if needed, she is also on a beta-blocker currently.  The patient is noting some problems with arthritic pain in the hips off and on.  She overall is doing much better.  Past  Medical History:  Diagnosis Date   Allergy    Anemia    on med - caltrate   Anxiety    Blood transfusion    09/2010   Chronic low back pain 09/21/2019   Enlarged heart    no problems per patient   GERD (gastroesophageal reflux disease)    diet -  otc med prn   Headache(784.0)    tx with otc ibuprofen   Hypertension    on meds   Lymphoma ([place])    ankle   Migraine headache 09/21/2019   Myasthenia gravis    rare - no current problems - if flares just affects eyes -see double- now entire body 05-12-2020   Pre-diabetes    Shortness of breath    with exertion   Vertigo 09/21/2019    Past Surgical History:  Procedure Laterality Date   APPENDECTOMY     age 34 yrs   CESAREAN SECTION     2000 - [place]     abd myomectomy 1990's   robotic supracervical hysterectomy  04/14/2011   WISDOM TOOTH EXTRACTION      Family History  Problem Relation Age of Onset   Hypertension Mother    Diabetes Mother    Thyroid nodules Mother    Heart disease Mother 38       CHF   Colon polyps Mother    Heart failure Father 7   Hypertension Father    Lung  cancer Brother 65   Heart disease Brother 85       CAD/CHF   Cancer Brother        Lung cancer   Cancer Daughter 71       lumphomia    Anesthesia problems Neg Hx    Hypotension Neg Hx    Malignant hyperthermia Neg Hx    Pseudochol deficiency Neg Hx    Crohn's disease Neg Hx    Esophageal cancer Neg Hx    Rectal cancer Neg Hx    Stomach cancer Neg Hx    Colon cancer Neg Hx     Social history:  reports that she quit smoking about 41 years ago. Her smoking use included cigarettes. She has never used smokeless tobacco. She reports current alcohol use. She reports that she does not use drugs.    Allergies  Allergen Reactions   Codeine Nausea And Vomiting and Other (See Comments)    Dizziness,  rooom spends   Etodolac Nausea Only   Hydrocodone-Acetaminophen Nausea And Vomiting    Medications:  Prior to  Admission medications   Medication Sig Start Date End Date Taking? Authorizing Provider  ferrous sulfate 325 (65 FE) MG tablet Take 1 tablet (325 mg total) by mouth 2 (two) times daily with a meal. 05/25/20   Libby Maw, MD  lisinopril-hydrochlorothiazide (ZESTORETIC) 20-25 MG tablet Take 1 tablet by mouth daily. 01/30/21   Libby Maw, MD  meclizine (ANTIVERT) 12.5 MG tablet Take 1 tablet (12.5 mg total) by mouth 3 (three) times daily as needed for dizziness. 05/25/20   Libby Maw, MD  metoprolol succinate (TOPROL-XL) 25 MG 24 hr tablet Take 1 tablet (25 mg total) by mouth daily. 01/30/21   Libby Maw, MD  multivitamin [facility]) per tablet Take 1 tablet by mouth daily.      [provider]  pyridostigmine (MESTINON) 60 MG tablet Take 1 tablet (60 mg total) by mouth 3 (three) times daily. 09/21/20   Suzzanne Cloud, NP  rizatriptan (MAXALT) 10 MG tablet Take 1 tablet (10 mg total) by mouth 3 (three) times daily as needed for migraine. 09/21/19   Kathrynn Ducking, MD  rizatriptan (MAXALT) 10 MG tablet Take 1 tablet (10 mg total) by mouth as needed for migraine. May repeat in 2 hours if needed 09/21/20   Suzzanne Cloud, NP  Vitamin D, Ergocalciferol, (DRISDOL) 1.25 MG (50000 UNIT) CAPS capsule Take 1 capsule (50,000 Units total) by mouth every 7 (seven) days. 05/25/20   Libby Maw, MD    Exam: NAD, pleasant                  Speech:    Speech is normal; fluent and spontaneous with normal comprehension.  Cognition:    The patient is oriented to person, place, and time;     recent and remote memory intact;     language fluent;    Cranial Nerves:    The pupils are equal, round, and reactive to light.Trigeminal sensation is intact and the muscles of mastication are normal. The face is symmetric. The palate elevates in the midline. Hearing intact. Voice is normal. Shoulder shrug is normal. The tongue has normal motion without fasciculations.    Coordination:  No dysmetria  Motor Observation:    No asymmetry, no atrophy, and no involuntary movements noted. Tone:    Normal muscle tone.     Strength:    Strength is V/V in the upper and lower  limbs.      Sensation: intact to LT    Assessment/Plan: 60 year old patient, I am taking over for Dr. Jannifer Franklin. Stable Ocular Myasthenia Gravis and migraines and vertigo. New issue of chronic fatigue.   1.  Ocular myasthenia gravis, seronegative: stable, Ocular, not generalized, has had for decades at this point very unlikely to generalize after so many years  2.  Migraine headache, vertigo: stable, episodic, cont maxalt   3. Fatigue and body pain/joint pain/FHx RA will test today. Declines sleep evaluation but I recommend. May discuss Rheum referrral with pcp given extensive FHx.   Orders Placed This Encounter  Procedures   ANA,IFA RA Diag Pnl w/rflx Tit/Patn   Meds ordered this encounter  Medications   rizatriptan (MAXALT) 10 MG tablet    Sig: Take 1 tablet (10 mg total) by mouth as needed for migraine. May repeat in 2 hours if needed    Dispense:  10 tablet    Refill:  11   pyridostigmine (MESTINON) 60 MG tablet    Sig: Take 1 tablet (60 mg total) by mouth 3 (three) times daily.    Dispense:  270 tablet    Refill:  3    This is updated rx     [facility] [address] [place], [place] [address]  Phone [phone] Fax [phone]  I spent 40 minutes of face-to-face and non-face-to-face time with patient on the  1. Ocular myasthenia ([place])   2. Migraine without status migrainosus, not intractable, unspecified migraine type   3. Arthralgia, unspecified joint   4. FHx: rheumatoid arthritis    diagnosis.  This included previsit chart review, lab review, study review, order entry, electronic health record documentation, patient education on the different diagnostic and therapeutic options, counseling and coordination of care, risks and  benefits of management, compliance, or risk factor reduction

## 2021-10-09 ENCOUNTER — Ambulatory Visit: Payer: BC Managed Care – PPO | Admitting: Neurology

## 2021-10-09 ENCOUNTER — Encounter: Payer: Self-pay | Admitting: Neurology

## 2021-10-09 VITALS — BP 155/86 | HR 65 | Ht 66.0 in | Wt 220.0 lb

## 2021-10-09 DIAGNOSIS — M2550 Pain in unspecified joint: Secondary | ICD-10-CM | POA: Insufficient documentation

## 2021-10-09 DIAGNOSIS — G43909 Migraine, unspecified, not intractable, without status migrainosus: Secondary | ICD-10-CM

## 2021-10-09 DIAGNOSIS — G7000 Myasthenia gravis without (acute) exacerbation: Secondary | ICD-10-CM

## 2021-10-09 DIAGNOSIS — Z8261 Family history of arthritis: Secondary | ICD-10-CM | POA: Insufficient documentation

## 2021-10-09 MED ORDER — PYRIDOSTIGMINE BROMIDE 60 MG PO TABS: 60.0000 mg | ORAL_TABLET | Freq: Three times a day (TID) | ORAL | 3 refills | Status: DC

## 2021-10-09 MED ORDER — RIZATRIPTAN BENZOATE 10 MG PO TABS: 10.0000 mg | ORAL_TABLET | ORAL | 11 refills | Status: DC | PRN

## 2021-10-09 NOTE — Patient Instructions (Addendum)
Check lab today Continue mestinon and maxalt  Myasthenia Gravis Myasthenia gravis (MG) is a long-term (chronic) condition that causes weakness in the muscles you can control (voluntary muscles). MG can affect any voluntary muscle. The muscles most often affected are the ones that control: Eye movement. Facial movements. Swallowing. MG is a disease in which the body's disease-fighting system (immune system) attacks its own healthy tissues (autoimmune disease). When you have MG, your immune system makes proteins (antibodies) that block a chemical (acetylcholine) that your body needs to send nerve signals to your muscles. This causes muscle weakness. What are the causes? The exact cause of MG is not known. What increases the risk? The following factors may make you more likely to develop this condition: Having an enlarged thymus gland. The thymus gland is located under the breastbone. It makes certain cells for the immune system. Having a family history of MG. What are the signs or symptoms? Symptoms of MG may include: Drooping eyelids. Double vision. Muscle weakness that gets worse with activity and gets better after rest. Difficulty walking. Trouble chewing and swallowing. Trouble making facial expressions. Slurred speech. Weakness of the arms, hands, and legs. Sudden, severe difficulty breathing (myasthenic crisis) may develop after having: An infection. A fever. A bad reaction to a medicine. Myasthenic crisis requires emergency breathing support. Sometimes symptoms of MG go away for a while (remission) and then come back later. How is this diagnosed? This condition may be diagnosed based on: Your symptoms and medical history. A physical exam. Blood tests. Tests of your muscle strength and function. Imaging tests, such as a CT scan or an MRI. How is this treated? The goal of treatment is to improve muscle strength. Treatment may include: Taking medicine. Making lifestyle  changes that focus on saving your energy. Doing physical therapy to gain strength. Having surgery to remove the thymus gland (thymectomy). This may result in a long remission for some people. Having a procedure to remove the acetylcholine antibodies (plasmapheresis). Getting emergency breathing support, if you experience myasthenic crisis. If you experience remission, you may be able to stop treatment and then resume treatment when your symptoms return. Follow these instructions at home:  Take over-the-counter and prescription medicines only as told by your health care provider. Get plenty of rest and sleep. Take frequent breaks to rest your eyes, especially when in bright light or working on a computer. Maintain a healthy diet and a healthy weight. Work with your health care provider or a diet and nutrition specialist (dietitian) if you need help. Do exercises as told by your health care provider or physical therapist. Do not use any products that contain nicotine or tobacco, such as cigarettes and e-cigarettes. If you need help quitting, ask your health care provider. Prevent infections by: Washing your hands often with soap and water. If soap and water are not available, use hand sanitizer. Avoiding contact with other people who are sick. Avoiding touching your eyes, nose, and mouth. Cleaning surfaces in your home that are touched often using a disinfectant. Keep all follow-up visits as told by your health care provider. This is important. Contact a health care provider if: Your symptoms change or get worse, especially after having a fever or infection. Get help right away if: You have trouble breathing. Summary Myasthenia gravis (MG) is a long-term (chronic) condition that causes weakness in the muscles you can control (voluntary muscles). A symptom of MG is muscle weakness that gets worse with activity and gets better after rest. Sudden,  severe difficulty breathing (myasthenic crisis)  may develop after having an infection, a fever, or a bad reaction to a medicine. The goal of treatment is to improve muscle strength. Treatment may include medicines, lifestyle changes, physical therapy, surgery, plasmapheresis, or emergency breathing support. This information is not intended to replace advice given to you by your health care provider. Make sure you discuss any questions you have with your health care provider. Document Revised: 06/16/2020 Document Reviewed: 06/16/2020 Elsevier Patient Education  2022 [employer].

## 2021-10-22 ENCOUNTER — Ambulatory Visit: Payer: BC Managed Care – PPO | Admitting: Neurology

## 2021-11-28 NOTE — Telephone Encounter (Signed)
Called patient to schedule follow up appointment on meds. Per patient she was in a meeting but will call back to schedule

## 2022-01-11 ENCOUNTER — Other Ambulatory Visit: Payer: Self-pay | Admitting: Family Medicine

## 2022-02-01 ENCOUNTER — Ambulatory Visit (INDEPENDENT_AMBULATORY_CARE_PROVIDER_SITE_OTHER): Payer: 59 | Admitting: Family Medicine

## 2022-02-01 ENCOUNTER — Encounter: Payer: Self-pay | Admitting: Family Medicine

## 2022-02-01 VITALS — BP 148/68 | HR 72 | Temp 97.5°F | Ht 66.0 in | Wt 218.0 lb

## 2022-02-01 DIAGNOSIS — E559 Vitamin D deficiency, unspecified: Secondary | ICD-10-CM

## 2022-02-01 DIAGNOSIS — I10 Essential (primary) hypertension: Secondary | ICD-10-CM | POA: Diagnosis not present

## 2022-02-01 DIAGNOSIS — E611 Iron deficiency: Secondary | ICD-10-CM | POA: Diagnosis not present

## 2022-02-01 DIAGNOSIS — M25579 Pain in unspecified ankle and joints of unspecified foot: Secondary | ICD-10-CM

## 2022-02-01 DIAGNOSIS — F341 Dysthymic disorder: Secondary | ICD-10-CM

## 2022-02-01 DIAGNOSIS — R69 Illness, unspecified: Secondary | ICD-10-CM | POA: Diagnosis not present

## 2022-02-01 LAB — BASIC METABOLIC PANEL
BUN: 16 mg/dL (ref 6–23)
CO2: 30 mEq/L (ref 19–32)
Calcium: 9.4 mg/dL (ref 8.4–10.5)
Chloride: 105 mEq/L (ref 96–112)
Creatinine, Ser: 0.77 mg/dL (ref 0.40–1.20)
GFR: 83.85 mL/min (ref >60.00)
Glucose, Bld: 85 mg/dL (ref 70–99)
Potassium: 3.7 mEq/L (ref 3.5–5.1)
Sodium: 142 mEq/L (ref 135–145)

## 2022-02-01 LAB — URINALYSIS, ROUTINE W REFLEX MICROSCOPIC
Bilirubin Urine: NEGATIVE
Ketones, ur: NEGATIVE
Leukocytes,Ua: NEGATIVE
Nitrite: NEGATIVE
Specific Gravity, Urine: 1.025 (ref 1.000–1.030)
Total Protein, Urine: NEGATIVE
Urine Glucose: NEGATIVE
Urobilinogen, UA: 0.2 (ref 0.0–1.0)
pH: 6 (ref 5.0–8.0)

## 2022-02-01 LAB — CBC
HCT: 35.4 % — ABNORMAL LOW (ref 36.0–46.0)
Hemoglobin: 11.5 g/dL — ABNORMAL LOW (ref 12.0–15.0)
MCHC: 32.3 g/dL (ref 30.0–36.0)
MCV: 81.4 fl (ref 78.0–100.0)
Platelets: 265 10*3/uL (ref 150.0–400.0)
RBC: 4.35 Mil/uL (ref 3.87–5.11)
RDW: 15.2 % (ref 11.5–15.5)
WBC: 3.5 10*3/uL — ABNORMAL LOW (ref 4.0–10.5)

## 2022-02-01 LAB — VITAMIN D 25 HYDROXY (VIT D DEFICIENCY, FRACTURES): VITD: 16.76 ng/mL — ABNORMAL LOW (ref 30.00–100.00)

## 2022-02-01 MED ORDER — LISINOPRIL-HYDROCHLOROTHIAZIDE 20-25 MG PO TABS: 1.0000 | ORAL_TABLET | Freq: Every day | ORAL | 1 refills | Status: DC

## 2022-02-01 MED ORDER — AMLODIPINE BESYLATE 5 MG PO TABS: 5.0000 mg | ORAL_TABLET | Freq: Every day | ORAL | 1 refills | Status: DC

## 2022-02-01 NOTE — Progress Notes (Signed)
? ?Established Patient Office Visit ? ?Subjective   ?Patient ID: [name], female    DOB: 06-Mar-1961  Age: 61 y.o. MRN: [id number] ? ?Chief Complaint  ?Patient presents with  ? Annual Exam  ?  CPE, would like referral to specialist for swelling in ankle also needs to see someone for nodule in left ankle.   ? ? ?HPI follow-up of hypertension, vitamin D deficiency and iron deficiency.  Has ongoing bilateral ankle pain that seems to be worse on the left.  She was told that she had a benign tumor that was evaluated with ultrasound.  She was treated with physical therapy.  This this did not seem to help.  She would like a second opinion.  She has been lost to follow-up for her blood pressure for over a year.  Was asked to return in 3 months after I saw her last.  She has been taking her Zestoretic.  She has been taking an over-the-counter iron preparation and not the iron sulfate prescription.  She has been taking vitamin D containing a multivitamin and was not able to procure the high-dose weekly pill.  Continues to follow-up with neurology for myasthenia gravis. ? ? ? ?Review of Systems  ?Constitutional:  Negative for chills, diaphoresis, malaise/fatigue and weight loss.  ?HENT: Negative.    ?Eyes: Negative.  Negative for blurred vision and double vision.  ?Cardiovascular:  Negative for chest pain.  ?Gastrointestinal:  Negative for abdominal pain.  ?Genitourinary: Negative.   ?Musculoskeletal:  Positive for joint pain. Negative for falls and myalgias.  ?Neurological:  Negative for speech change, loss of consciousness and weakness.  ?Psychiatric/Behavioral: Negative.    ? ?  ? ?  02/01/2022  ? 11:08 AM 03/30/2020  ?  5:18 PM 03/30/2020  ?  2:31 PM  ?Depression screen PHQ 2/9  ?Decreased Interest 0 3 0  ?Down, Depressed, Hopeless 1 0 0  ?PHQ - 2 Score 1 3 0  ?Altered sleeping 3 1   ?Tired, decreased energy 3 3   ?Change in appetite 0 1   ?Feeling bad or failure about yourself  0 0   ?Trouble concentrating 0 0   ?Moving  slowly or fidgety/restless 0 0   ?Suicidal thoughts 0 0   ?PHQ-9 Score 7 8   ?Difficult doing work/chores Very difficult    ? ? ? ?Objective:  ?  ? ?BP (!) 148/68 (BP Location: Right Arm, Patient Position: Sitting, Cuff Size: Large)   Pulse 72   Temp (!) 97.5 ?F (36.4 ?C) (Temporal)   Ht '5\' 6"'$  (1.676 m)   Wt 218 lb (98.9 kg)   LMP 01/04/2011   SpO2 94%   BMI 35.19 kg/m?  ?Wt Readings from Last 3 Encounters:  ?02/01/22 218 lb (98.9 kg)  ?10/09/21 220 lb (99.8 kg)  ?08/16/21 215 lb (97.5 kg)  ? ?  ? ?Physical Exam ?Constitutional:   ?   General: She is not in acute distress. ?   Appearance: Normal appearance. She is not ill-appearing, toxic-appearing or diaphoretic.  ?HENT:  ?   Head: Normocephalic and atraumatic.  ?   Right Ear: External ear normal.  ?   Left Ear: External ear normal.  ?   Mouth/Throat:  ?   Mouth: Mucous membranes are moist.  ?   Pharynx: Oropharynx is clear. No oropharyngeal exudate or posterior oropharyngeal erythema.  ?Eyes:  ?   General: No scleral icterus.    ?   Right eye: No discharge.     ?  Left eye: No discharge.  ?   Extraocular Movements: Extraocular movements intact.  ?   Conjunctiva/sclera: Conjunctivae normal.  ?   Pupils: Pupils are equal, round, and reactive to light.  ?Cardiovascular:  ?   Rate and Rhythm: Normal rate and regular rhythm.  ?Pulmonary:  ?   Effort: Pulmonary effort is normal. No respiratory distress.  ?   Breath sounds: Normal breath sounds.  ?Abdominal:  ?   General: Bowel sounds are normal.  ?   Tenderness: There is no abdominal tenderness. There is no guarding.  ?Musculoskeletal:  ?   Cervical back: No rigidity or tenderness.  ?   Right ankle: No swelling. Normal range of motion.  ?   Left ankle: No swelling. Normal range of motion.  ?Skin: ?   General: Skin is warm and dry.  ?Neurological:  ?   Mental Status: She is alert and oriented to person, place, and time.  ?Psychiatric:     ?   Mood and Affect: Mood normal.     ?   Behavior: Behavior normal.   ? ? ? ?No results found for any visits on 02/01/22. ? ? ? ?The ASCVD Risk score (Arnett DK, et al., 2019) failed to calculate for the following reasons: ?  Cannot find a previous HDL lab ?  Cannot find a previous total cholesterol lab ? ?  ?Assessment & Plan:  ? ?Problem List Items Addressed This Visit   ? ?  ? Cardiovascular and Mediastinum  ? Essential hypertension - Primary  ? Relevant Medications  ? lisinopril-hydrochlorothiazide (ZESTORETIC) 20-25 MG tablet  ? amLODipine (NORVASC) 5 MG tablet  ? Other Relevant Orders  ? Basic metabolic panel  ? Urinalysis, Routine w reflex microscopic  ?  ? Other  ? Iron deficiency  ? Relevant Orders  ? CBC  ? Iron, TIBC and Ferritin Panel  ? Vitamin D deficiency  ? Relevant Orders  ? VITAMIN D 25 Hydroxy (Vit-D Deficiency, Fractures)  ? Ankle pain  ? Relevant Orders  ? Ambulatory referral to Orthopedic Surgery  ? ?Other Visit Diagnoses   ? ? Dysthymia      ? ?  ? ? ?Return in about 3 months (around 05/03/2022).  ?Continue Zestoretic.  Have added amlodipine.  Information given on managing hypertension.  Will probably need a high-dose vitamin D and elemental iron.  Referral to foot specialist for ongoing ankle issues and concerns.  Patient has intermittent sadness.  She admits early morning wakening's but then returns to sleep promptly.  Discussed talking therapy.  She will think about this and let me know. ? ?Libby Maw, MD ? ?

## 2022-02-02 LAB — IRON,TIBC AND FERRITIN PANEL
%SAT: 18 % (calc) (ref 16–45)
Ferritin: 115 ng/mL (ref 16–232)
Iron: 52 ug/dL (ref 45–160)
TIBC: 294 mcg/dL (calc) (ref 250–450)

## 2022-03-14 ENCOUNTER — Other Ambulatory Visit: Payer: Self-pay | Admitting: Family Medicine

## 2022-04-22 ENCOUNTER — Other Ambulatory Visit: Payer: Self-pay | Admitting: Family Medicine

## 2022-05-03 ENCOUNTER — Ambulatory Visit: Payer: 59 | Admitting: Family Medicine

## 2022-05-03 ENCOUNTER — Encounter: Payer: Self-pay | Admitting: Family Medicine

## 2022-05-03 VITALS — BP 142/70 | HR 72 | Temp 97.6°F | Ht 66.0 in | Wt 220.6 lb

## 2022-05-03 DIAGNOSIS — G43909 Migraine, unspecified, not intractable, without status migrainosus: Secondary | ICD-10-CM

## 2022-05-03 DIAGNOSIS — E611 Iron deficiency: Secondary | ICD-10-CM | POA: Diagnosis not present

## 2022-05-03 DIAGNOSIS — I10 Essential (primary) hypertension: Secondary | ICD-10-CM | POA: Diagnosis not present

## 2022-05-03 DIAGNOSIS — E559 Vitamin D deficiency, unspecified: Secondary | ICD-10-CM | POA: Diagnosis not present

## 2022-05-03 MED ORDER — AMLODIPINE BESYLATE 5 MG PO TABS: 5.0000 mg | ORAL_TABLET | Freq: Every day | ORAL | 1 refills | Status: DC

## 2022-05-03 MED ORDER — VITAMIN D (ERGOCALCIFEROL) 1.25 MG (50000 UNIT) PO CAPS: 50000.0000 [IU] | ORAL_CAPSULE | ORAL | 2 refills | Status: DC

## 2022-05-03 MED ORDER — LISINOPRIL-HYDROCHLOROTHIAZIDE 20-25 MG PO TABS: 1.0000 | ORAL_TABLET | Freq: Every day | ORAL | 1 refills | Status: DC

## 2022-05-03 MED ORDER — METOPROLOL SUCCINATE ER 25 MG PO TB24: 25.0000 mg | ORAL_TABLET | Freq: Every day | ORAL | 1 refills | Status: DC

## 2022-05-03 MED ORDER — RIZATRIPTAN BENZOATE 10 MG PO TABS: 10.0000 mg | ORAL_TABLET | ORAL | 11 refills | Status: DC | PRN

## 2022-05-03 NOTE — Progress Notes (Signed)
Established Patient Office Visit  Subjective   Patient ID: [name], female    DOB: 03/14/1961  Age: 61 y.o. MRN: [id number]  Chief Complaint  Patient presents with   Follow-up    3 month follow, check mass on left ankle.     HPI follow-up of hypertension, iron deficiency anemia, vitamin D deficiency and migraine.  Migraines are well controlled with Maxalt.  She requests a refill because she is back in the office for 3 days a week and the fluorescent lights have set off her migraines in the past.  She has not started the high-dose weekly 50,000 IU pill.  Continues with over-the-counter iron levels are maintained with gummy bears.  She has longstanding history of iron deficiency.  There is a strong family history.  She did not start the amlodipine but continues with Zestoretic alone.  She did see the orthopedic surgeon Dr. 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  HENT: Negative.    Eyes: Negative.  Negative for blurred vision and double vision.  Cardiovascular:  Negative for chest pain.  Gastrointestinal:  Negative for abdominal pain.  Genitourinary: Negative.   Musculoskeletal:  Positive for joint pain. Negative for falls and myalgias.  Neurological:  Negative for speech change, loss of consciousness and weakness.  Psychiatric/Behavioral: Negative.        Objective:     BP (!) 142/70 (BP Location: Left Arm, Patient Position: Sitting, Cuff Size: Large)   Pulse 72   Temp 97.6 F (36.4 C) (Temporal)   Ht '5\' 6"'$  (1.676 m)   Wt 220 lb 9.6 oz (100.1 kg)   LMP 01/04/2011   SpO2 94%   BMI 35.61 kg/m  BP Readings from Last 3 Encounters:  05/03/22 (!) 142/70  02/01/22 (!) 148/68  10/09/21 (!) 155/86   Wt Readings from Last 3 Encounters:  05/03/22 220 lb 9.6 oz (100.1 kg)  02/01/22 218 lb (98.9 kg)  10/09/21 220 lb (99.8 kg)      Physical  Exam Constitutional:      General: She is not in acute distress.    Appearance: Normal appearance. She is not ill-appearing, toxic-appearing or diaphoretic.  HENT:     Head: Normocephalic and atraumatic.     Right Ear: External ear normal.     Left Ear: External ear normal.     Mouth/Throat:     Mouth: Mucous membranes are moist.     Pharynx: Oropharynx is clear. No oropharyngeal exudate or posterior oropharyngeal erythema.  Eyes:     General: No scleral icterus.       Right eye: No discharge.        Left eye: No discharge.     Extraocular Movements: Extraocular movements intact.     Conjunctiva/sclera: Conjunctivae normal.  Cardiovascular:     Rate and Rhythm: Normal rate and regular rhythm.  Pulmonary:     Effort: Pulmonary effort is normal. No respiratory distress.     Breath sounds: Normal breath sounds.  Musculoskeletal:     Cervical back: No rigidity or tenderness.  Skin:    General: Skin is warm and dry.  Neurological:     Mental Status: She is alert and oriented to person, place, and time.  Psychiatric:        Mood and Affect: Mood normal.        Behavior: Behavior normal.  No results found for any visits on 05/03/22.    The ASCVD Risk score (Arnett DK, et al., 2019) failed to calculate for the following reasons:   Cannot find a previous HDL lab   Cannot find a previous total cholesterol lab    Assessment & Plan:   Problem List Items Addressed This Visit       Cardiovascular and Mediastinum   Essential hypertension - Primary   Relevant Medications   amLODipine (NORVASC) 5 MG tablet   lisinopril-hydrochlorothiazide (ZESTORETIC) 20-25 MG tablet   metoprolol succinate (TOPROL-XL) 25 MG 24 hr tablet   Migraine without status migrainosus, not intractable   Relevant Medications   amLODipine (NORVASC) 5 MG tablet   lisinopril-hydrochlorothiazide (ZESTORETIC) 20-25 MG tablet   metoprolol succinate (TOPROL-XL) 25 MG 24 hr tablet   rizatriptan (MAXALT) 10  MG tablet     Other   Iron deficiency   Vitamin D deficiency   Relevant Medications   Vitamin D, Ergocalciferol, (DRISDOL) 1.25 MG (50000 UNIT) CAPS capsule    Return in about 3 months (around 08/03/2022).  Start high-dose weekly vitamin D tablet, add amlodipine 5 mg to Zestoretic and Toprol-XL continue iron Gummies, they seem to be working.  Maxalt as needed for migraine.  Recheck blood levels next visit.  Libby Maw, MD

## 2022-07-11 ENCOUNTER — Telehealth: Payer: Self-pay | Admitting: Family Medicine

## 2022-07-11 DIAGNOSIS — E559 Vitamin D deficiency, unspecified: Secondary | ICD-10-CM

## 2022-07-11 MED ORDER — VITAMIN D (ERGOCALCIFEROL) 1.25 MG (50000 UNIT) PO CAPS: 50000.0000 [IU] | ORAL_CAPSULE | ORAL | 2 refills | Status: DC

## 2022-07-11 NOTE — Telephone Encounter (Signed)
Caller Name: Skyley Call back phone #: [phone]   MEDICATION(S):  Vitamin D (Ergocalciferol)  Days of Med Remaining:   Has the patient contacted their pharmacy (YES/NO)? no What did pharmacy advise?   Preferred Pharmacy:  [facility] [id number] [place], [place] - [address] Phone:  [phone]      ~~~Please advise patient/caregiver to allow 2-3 business days to process RX refills.  Pt stated can you give her a refill on any other medication that it is time for so she can just make one trip

## 2022-09-04 ENCOUNTER — Ambulatory Visit: Payer: 59 | Admitting: Family Medicine

## 2022-10-25 ENCOUNTER — Telehealth: Payer: Self-pay | Admitting: Gastroenterology

## 2022-10-25 NOTE — Telephone Encounter (Signed)
Patient called to schedule colonoscopy.  I told her she wasn't due until 2027, but she thought she was supposed to be seen every year.  Please let me know which is correct.  Thank you.

## 2022-10-28 NOTE — Telephone Encounter (Signed)
Pt notified that she was due in 2027 for the Colonoscopy: Pt verbalized understanding with all questions answered.

## 2022-11-06 ENCOUNTER — Ambulatory Visit (INDEPENDENT_AMBULATORY_CARE_PROVIDER_SITE_OTHER): Payer: No Typology Code available for payment source | Admitting: Family Medicine

## 2022-11-06 ENCOUNTER — Encounter: Payer: Self-pay | Admitting: Family Medicine

## 2022-11-06 ENCOUNTER — Ambulatory Visit: Payer: No Typology Code available for payment source | Admitting: Neurology

## 2022-11-06 ENCOUNTER — Encounter: Payer: Self-pay | Admitting: Neurology

## 2022-11-06 VITALS — BP 140/84 | HR 60 | Temp 96.5°F | Ht 66.0 in | Wt 221.3 lb

## 2022-11-06 VITALS — BP 168/79 | HR 60 | Ht 66.0 in | Wt 222.0 lb

## 2022-11-06 DIAGNOSIS — G43109 Migraine with aura, not intractable, without status migrainosus: Secondary | ICD-10-CM

## 2022-11-06 DIAGNOSIS — G43909 Migraine, unspecified, not intractable, without status migrainosus: Secondary | ICD-10-CM

## 2022-11-06 DIAGNOSIS — R5383 Other fatigue: Secondary | ICD-10-CM

## 2022-11-06 DIAGNOSIS — Z1231 Encounter for screening mammogram for malignant neoplasm of breast: Secondary | ICD-10-CM

## 2022-11-06 DIAGNOSIS — E669 Obesity, unspecified: Secondary | ICD-10-CM | POA: Diagnosis not present

## 2022-11-06 DIAGNOSIS — D649 Anemia, unspecified: Secondary | ICD-10-CM

## 2022-11-06 DIAGNOSIS — M25572 Pain in left ankle and joints of left foot: Secondary | ICD-10-CM | POA: Diagnosis not present

## 2022-11-06 DIAGNOSIS — M25571 Pain in right ankle and joints of right foot: Secondary | ICD-10-CM

## 2022-11-06 DIAGNOSIS — I10 Essential (primary) hypertension: Secondary | ICD-10-CM | POA: Diagnosis not present

## 2022-11-06 DIAGNOSIS — R400 Somnolence: Secondary | ICD-10-CM | POA: Diagnosis not present

## 2022-11-06 DIAGNOSIS — Z0000 Encounter for general adult medical examination without abnormal findings: Secondary | ICD-10-CM

## 2022-11-06 DIAGNOSIS — R519 Headache, unspecified: Secondary | ICD-10-CM

## 2022-11-06 DIAGNOSIS — E559 Vitamin D deficiency, unspecified: Secondary | ICD-10-CM

## 2022-11-06 DIAGNOSIS — G7000 Myasthenia gravis without (acute) exacerbation: Secondary | ICD-10-CM

## 2022-11-06 LAB — CBC WITH DIFFERENTIAL/PLATELET
Basophils Absolute: 0 10*3/uL (ref 0.0–0.1)
Basophils Relative: 0.2 % (ref 0.0–3.0)
Eosinophils Absolute: 0 10*3/uL (ref 0.0–0.7)
Eosinophils Relative: 0.9 % (ref 0.0–5.0)
HCT: 36.4 % (ref 36.0–46.0)
Hemoglobin: 11.7 g/dL — ABNORMAL LOW (ref 12.0–15.0)
Lymphocytes Relative: 44 % (ref 12.0–46.0)
Lymphs Abs: 1.4 10*3/uL (ref 0.7–4.0)
MCHC: 32.3 g/dL (ref 30.0–36.0)
MCV: 81.6 fl (ref 78.0–100.0)
Monocytes Absolute: 0.4 10*3/uL (ref 0.1–1.0)
Monocytes Relative: 12.8 % — ABNORMAL HIGH (ref 3.0–12.0)
Neutro Abs: 1.4 10*3/uL (ref 1.4–7.7)
Neutrophils Relative %: 42.1 % — ABNORMAL LOW (ref 43.0–77.0)
Platelets: 272 10*3/uL (ref 150.0–400.0)
RBC: 4.46 Mil/uL (ref 3.87–5.11)
RDW: 14.9 % (ref 11.5–15.5)
WBC: 3.3 10*3/uL — ABNORMAL LOW (ref 4.0–10.5)

## 2022-11-06 LAB — LIPID PANEL
Cholesterol: 162 mg/dL (ref 0–200)
HDL: 47.9 mg/dL (ref >39.00)
LDL Cholesterol: 103 mg/dL — ABNORMAL HIGH (ref 0–99)
NonHDL: 114.19
Total CHOL/HDL Ratio: 3
Triglycerides: 58 mg/dL (ref 0.0–149.0)
VLDL: 11.6 mg/dL (ref 0.0–40.0)

## 2022-11-06 LAB — URINALYSIS, ROUTINE W REFLEX MICROSCOPIC
Bilirubin Urine: NEGATIVE
Ketones, ur: NEGATIVE
Leukocytes,Ua: NEGATIVE
Nitrite: NEGATIVE
Specific Gravity, Urine: 1.025 (ref 1.000–1.030)
Total Protein, Urine: NEGATIVE
Urine Glucose: NEGATIVE
Urobilinogen, UA: 0.2 (ref 0.0–1.0)
pH: 6.5 (ref 5.0–8.0)

## 2022-11-06 LAB — COMPREHENSIVE METABOLIC PANEL
ALT: 15 U/L (ref 0–35)
AST: 20 U/L (ref 0–37)
Albumin: 4.1 g/dL (ref 3.5–5.2)
Alkaline Phosphatase: 87 U/L (ref 39–117)
BUN: 18 mg/dL (ref 6–23)
CO2: 28 mEq/L (ref 19–32)
Calcium: 9.4 mg/dL (ref 8.4–10.5)
Chloride: 105 mEq/L (ref 96–112)
Creatinine, Ser: 0.78 mg/dL (ref 0.40–1.20)
GFR: 82.12 mL/min (ref >60.00)
Glucose, Bld: 94 mg/dL (ref 70–99)
Potassium: 3.7 mEq/L (ref 3.5–5.1)
Sodium: 141 mEq/L (ref 135–145)
Total Bilirubin: 0.4 mg/dL (ref 0.2–1.2)
Total Protein: 7 g/dL (ref 6.0–8.3)

## 2022-11-06 LAB — VITAMIN D 25 HYDROXY (VIT D DEFICIENCY, FRACTURES): VITD: 45.08 ng/mL (ref 30.00–100.00)

## 2022-11-06 LAB — TSH: TSH: 3.8 u[IU]/mL (ref 0.35–5.50)

## 2022-11-06 LAB — HEMOGLOBIN A1C: Hgb A1c MFr Bld: 6.5 % (ref 4.6–6.5)

## 2022-11-06 LAB — B12 AND FOLATE PANEL
Folate: 15.8 ng/mL (ref >5.9)
Vitamin B-12: 429 pg/mL (ref 211–911)

## 2022-11-06 MED ORDER — TRIAMTERENE-HCTZ 37.5-25 MG PO TABS: 1.0000 | ORAL_TABLET | Freq: Every day | ORAL | 3 refills | Status: DC

## 2022-11-06 MED ORDER — METOPROLOL SUCCINATE ER 25 MG PO TB24: 25.0000 mg | ORAL_TABLET | Freq: Every day | ORAL | 1 refills | Status: DC

## 2022-11-06 MED ORDER — LISINOPRIL 20 MG PO TABS: 20.0000 mg | ORAL_TABLET | Freq: Every day | ORAL | 3 refills | Status: DC

## 2022-11-06 NOTE — Patient Instructions (Addendum)
Referral to sleep team - Dr. Brett Fairy Continue current treatment F/u year

## 2022-11-06 NOTE — Progress Notes (Signed)
Established Patient Office Visit   Subjective:  Patient ID: [name], female    DOB: August 19, 1961  Age: 62 y.o. MRN: [id number]  Chief Complaint  Patient presents with   Annual Exam    CPE, concerns about continued swelling/knot in left ankle. Patient fasting.     HPI Encounter Diagnoses  Name Primary?   Essential hypertension Yes   Vitamin D deficiency    Healthcare maintenance    Breast cancer screening by mammogram    Migraine without status migrainosus, not intractable, unspecified migraine type    Fatigue, unspecified type    Normocytic anemia    Obesity (BMI 30-39.9)    Bilateral ankle pain, unspecified chronicity    Here for health check and follow-up of medical issues.  Blood pressure has been running 140s over 70 to 80s on current regimen.  She was unable to tolerate amlodipine.  It had caused fatigue and she discontinued it.  Myasthenia gravis is well-controlled on current medication.  She does report some fatigue.  She is in search of a new dentist.  She is maintaining her household as well as her mother's.  Recently laid off and is looking for a new job.  Her search is going well.  She will see her GYN and eye doctors later today.   Review of Systems  Constitutional: Negative.   HENT: Negative.    Eyes:  Negative for blurred vision, discharge and redness.  Respiratory: Negative.    Cardiovascular: Negative.   Gastrointestinal:  Negative for abdominal pain.  Genitourinary: Negative.   Musculoskeletal: Negative.  Negative for myalgias.  Skin:  Negative for rash.  Neurological:  Negative for tingling, loss of consciousness and weakness.  Endo/Heme/Allergies:  Negative for polydipsia.     Current Outpatient Medications:    lisinopril (ZESTRIL) 20 MG tablet, Take 1 tablet (20 mg total) by mouth daily., Disp: 90 tablet, Rfl: 3   multivitamin (THERAGRAN) per tablet, Take 1 tablet by mouth daily.  , Disp: , Rfl:    pyridostigmine (MESTINON) 60 MG tablet, Take 60  mg by mouth 3 (three) times daily., Disp: , Rfl:    rizatriptan (MAXALT) 10 MG tablet, Take 1 tablet (10 mg total) by mouth as needed for migraine. May repeat in 2 hours if needed, Disp: 10 tablet, Rfl: 11   triamterene-hydrochlorothiazide (MAXZIDE-25) 37.5-25 MG tablet, Take 1 tablet by mouth daily., Disp: 90 tablet, Rfl: 3   Vitamin D, Ergocalciferol, (DRISDOL) 1.25 MG (50000 UNIT) CAPS capsule, Take 1 capsule (50,000 Units total) by mouth every 7 (seven) days., Disp: 15 capsule, Rfl: 2   meclizine (ANTIVERT) 12.5 MG tablet, Take 1 tablet (12.5 mg total) by mouth 3 (three) times daily as needed for dizziness. (Patient not taking: Reported on 11/06/2022), Disp: 40 tablet, Rfl: 1   metoprolol succinate (TOPROL-XL) 25 MG 24 hr tablet, Take 1 tablet (25 mg total) by mouth daily., Disp: 90 tablet, Rfl: 1  Current Facility-Administered Medications:    0.9 %  sodium chloride infusion, 500 mL, Intravenous, Once, Jackquline Denmark, MD   Objective:     BP (!) 142/74 (BP Location: Right Arm, Patient Position: Sitting, Cuff Size: Large)   Pulse 60   Temp (!) 96.5 F (35.8 C) (Temporal)   Ht '5\' 6"'$  (1.676 m)   Wt 221 lb 4.8 oz (100.4 kg)   LMP 01/04/2011   SpO2 97%   BMI 35.72 kg/m    Physical Exam Constitutional:      General: She is not in  acute distress.    Appearance: Normal appearance. She is not ill-appearing, toxic-appearing or diaphoretic.  HENT:     Head: Normocephalic and atraumatic.     Right Ear: Tympanic membrane, ear canal and external ear normal.     Left Ear: Tympanic membrane, ear canal and external ear normal.     Mouth/Throat:     Mouth: Mucous membranes are moist.     Pharynx: Oropharynx is clear. No oropharyngeal exudate or posterior oropharyngeal erythema.  Eyes:     General: No scleral icterus.       Right eye: No discharge.        Left eye: No discharge.     Extraocular Movements: Extraocular movements intact.     Conjunctiva/sclera: Conjunctivae normal.     Pupils:  Pupils are equal, round, and reactive to light.  Cardiovascular:     Rate and Rhythm: Normal rate and regular rhythm.     Pulses:          Dorsalis pedis pulses are 2+ on the left side.       Posterior tibial pulses are 1+ on the left side.  Pulmonary:     Effort: Pulmonary effort is normal. No respiratory distress.     Breath sounds: Normal breath sounds.  Abdominal:     General: Bowel sounds are normal.  Musculoskeletal:     Cervical back: No rigidity or tenderness.     Right lower leg: No edema.     Left lower leg: No edema.     Left ankle: Normal range of motion.     Left Achilles Tendon: Thompson's test negative.     Comments: Left foot is pes planus.  There is swelling anterior to the Achilles tendon.  Skin:    General: Skin is warm and dry.  Neurological:     Mental Status: She is alert and oriented to person, place, and time.  Psychiatric:        Mood and Affect: Mood normal.        Behavior: Behavior normal.   Yes she would benefit from homograft abdominal vascular if you like me to send her back to Dr. Dennis Bast can do that he   No results found for any visits on 11/06/22.    The ASCVD Risk score (Arnett DK, et al., 2019) failed to calculate for the following reasons:   Cannot find a previous HDL lab   Cannot find a previous total cholesterol lab    Assessment & Plan:   Essential hypertension -     Metoprolol Succinate ER; Take 1 tablet (25 mg total) by mouth daily.  Dispense: 90 tablet; Refill: 1 -     Lisinopril; Take 1 tablet (20 mg total) by mouth daily.  Dispense: 90 tablet; Refill: 3 -     Triamterene-HCTZ; Take 1 tablet by mouth daily.  Dispense: 90 tablet; Refill: 3 -     CBC with Differential/Platelet -     Comprehensive metabolic panel -     Urinalysis, Routine w reflex microscopic  Vitamin D deficiency -     VITAMIN D 25 Hydroxy (Vit-D Deficiency, Fractures)  Healthcare maintenance -     Lipid panel  Breast cancer screening by mammogram -      Digital Screening Mammogram, Left and Right; Future  Migraine without status migrainosus, not intractable, unspecified migraine type -     Metoprolol Succinate ER; Take 1 tablet (25 mg total) by mouth daily.  Dispense: 90 tablet; Refill: 1  Fatigue,  unspecified type -     CBC with Differential/Platelet -     Hemoglobin A1c -     TSH  Normocytic anemia -     CBC with Differential/Platelet -     Iron, TIBC and Ferritin Panel -     B12 and Folate Panel  Obesity (BMI 30-39.9)  Bilateral ankle pain, unspecified chronicity -     Ambulatory referral to Orthopedic Surgery    Return in about 3 months (around 02/05/2023).  Information was given on health maintenance and disease prevention.  Information was also given on exercising to lose weight.  Believe that that would help her energy levels.  For her blood pressure have discontinued Zestoretic.  Start lisinopril 20 with Maxide.  She will check and record her blood pressures.  Bring her cuff next visit.  Continue follow-up with neurology.  Encouraged dental care and exercise.    Libby Maw, MD

## 2022-11-06 NOTE — Progress Notes (Signed)
Reason for visit: Ocular myasthenia gravis, migraine headache  [name] is an 62 y.o. female  11/11/2022: Migraines are occ and stable and medication helps. Mestinon works, hasn;t gotten worse, no weakness, no swallowing difficulty, no coughing, no dysphagia, facial strengthis good, no SOB unless really exerting herself, no blurry vision, droopy eyelids, overall feeling strong and well. Mestinon 3x a day, no side effects, if miss a dose can tell, no progression. She does wake often, is fatigued during the day, wants to nod off, doesn't know if she snores, nocturnal/morning headaches, keeps water by the bed because of dry mouth in the morning.   Patient complains of symptoms per HPI as well as the following symptoms: none . Pertinent negatives and positives per HPI. All others negative   10/09/2021: Patient transitioning to me from Dr. Jannifer Franklin with a hx of stable migraines and ocular myasthenia gravis. She has low energy level, she has low back pain, the mestinon works fr the ocular myasthenia gravis, since 1995. She had a sleep test at least 10 years ago, she has gained 20 pounds, discussed sleep apnea can cause fatigue, she declines a sleep test, discussed sequelae of untreated sleep apnea, she says it was a waste of time and declines. She has felt fatigued for years. She has low back pain and unclear why. She had an MRI low back. She has more aching and not weakness so not likely myasthenia gravis. Recommend Dr. Ethelene Hal a rheumatologic evaluation. Mom and brother have RF. Ongoing for year and more joint pain. Overall the mestinon 3x a day fixes her eye problems. Migraines episodic, maxalt helps acutely.   MRI brain normal 06/2020: normal personally reviewed images   03/2020: TSH, B!2 was normal.  09/21/2020: 0.09 AChR Binding Ab Vitamin D 25.57 (discussed she has Vi D def see Dr. Ethelene Hal)  Patient complains of symptoms per HPI as well as the following symptoms: fatigue . Pertinent negatives  and positives per HPI. All others negative   History of present illness:  [name] is a 62 year old right-handed black female with a history of ocular myasthenia gravis treated with Mestinon.  The patient has gained good improvement with the Mestinon taking 60 mg 3 times daily.  The patient has had problems with migraine, she was placed on Topamax but could not really tolerate the medication well as it caused cognitive clouding.  The patient however has had improvement in her headaches and vertigo, she only takes the Maxalt if needed, she is also on a beta-blocker currently.  The patient is noting some problems with arthritic pain in the hips off and on.  She overall is doing much better.  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   Migraine headache 09/21/2019   Myasthenia gravis    rare - no current problems - if flares just affects eyes -see double- now entire body 05-12-2020   Pre-diabetes    Shortness of breath    with exertion   Vertigo 09/21/2019    Past  Surgical History:  Procedure Laterality Date   APPENDECTOMY     age 66 yrs   CESAREAN SECTION     2000 - [place]     abd myomectomy 1990's   robotic supracervical hysterectomy  04/14/2011   WISDOM TOOTH EXTRACTION      Family History  Problem Relation Age of Onset   Hypertension Mother    Diabetes Mother    Thyroid nodules Mother    Heart disease Mother 53       CHF   Colon polyps Mother    Heart failure Father 68   Hypertension Father    Lung cancer Brother 84   Heart disease Brother 41       CAD/CHF   Cancer Brother        Lung cancer   Cancer Daughter 58        lumphomia    Anesthesia problems Neg Hx    Hypotension Neg Hx    Malignant hyperthermia Neg Hx    Pseudochol deficiency Neg Hx    Crohn's disease Neg Hx    Esophageal cancer Neg Hx    Rectal cancer Neg Hx    Stomach cancer Neg Hx    Colon cancer Neg Hx     Social history:  reports that she quit smoking about 42 years ago. Her smoking use included cigarettes. She has never used smokeless tobacco. She reports current alcohol use. She reports that she does not use drugs.    Allergies  Allergen Reactions   Amlodipine     Fatigue    Codeine Nausea And Vomiting and Other (See Comments)    Dizziness,  rooom spends   Etodolac Nausea Only   Hydrocodone-Acetaminophen Nausea And Vomiting    Medications:  Prior to Admission medications   Medication Sig Start Date End Date Taking? Authorizing Provider  ferrous sulfate 325 (65 FE) MG tablet Take 1 tablet (325 mg total) by mouth 2 (two) times daily with a meal. 05/25/20   Libby Maw, MD  lisinopril-hydrochlorothiazide (ZESTORETIC) 20-25 MG tablet Take 1 tablet by mouth daily. 01/30/21   Libby Maw, MD  meclizine (ANTIVERT) 12.5 MG tablet Take 1 tablet (12.5 mg total) by mouth 3 (three) times daily as needed for dizziness. 05/25/20   Libby Maw, MD  metoprolol succinate (TOPROL-XL) 25 MG 24 hr tablet Take 1 tablet (25 mg total) by mouth daily. 01/30/21   Libby Maw, MD  multivitamin [facility]) per tablet Take 1 tablet by mouth daily.      [provider]  pyridostigmine (MESTINON) 60 MG tablet Take 1 tablet (60 mg total) by mouth 3 (three) times daily. 09/21/20   Suzzanne Cloud, NP  rizatriptan (MAXALT) 10 MG tablet Take 1 tablet (10 mg total) by mouth 3 (three) times daily as needed for migraine. 09/21/19   Kathrynn Ducking, MD  rizatriptan (MAXALT) 10 MG tablet Take 1 tablet (10 mg total) by mouth as needed for migraine. May repeat in 2 hours if needed 09/21/20   Suzzanne Cloud, NP  Vitamin  D, Ergocalciferol, (DRISDOL) 1.25 MG (50000 UNIT) CAPS capsule Take 1 capsule (50,000 Units total) by mouth every 7 (seven) days. 05/25/20   Libby Maw, MD   Exam: NAD, pleasant                  Speech:    Speech is normal; fluent and spontaneous with normal comprehension.  Cognition:  The patient is oriented to person, place, and time;     recent and remote memory intact;     language fluent;    Cranial Nerves:    The pupils are equal, round, and reactive to light.Trigeminal sensation is intact and the muscles of mastication are normal. The face is symmetric. The palate elevates in the midline. Hearing intact. Voice is normal. Shoulder shrug is normal. The tongue has normal motion without fasciculations.   Coordination:  No dysmetria  Motor Observation:    No asymmetry, no atrophy, and no involuntary movements noted. Tone:    Normal muscle tone.     Strength:    Strength is V/V in the upper and lower limbs. Weakness head flex/ext.     Sensation: intact to LT     Assessment/Plan: 62 year old patient, I am taking over for Dr. Jannifer Franklin. Stable Ocular Myasthenia Gravis and migraines and vertigo. New issue of chronic fatigue.   1.  Ocular myasthenia gravis, seronegative: stable, Ocular, not generalized, has had for decades at this point very unlikely to generalize after so many years, cpntinue mestinon  2.  Migraine headache, vertigo: stable, episodic, cont maxalt   3. Myasthenia gravis, weakness of head ext/flex which is sensitive for diaphragm/breathing issues, will send for sleep examination to ensure no OSA due to extreme fatigue, She does wake often, is fatigued during the day, wants to nod off, doesn't know if she snores, nocturnal/morning headaches, keeps water by the bed because of dry mouth in the morning.   Orders Placed This Encounter  Procedures   Ambulatory referral to Sleep Studies   Meds ordered this encounter  Medications   pyridostigmine (MESTINON)  60 MG tablet    Sig: Take 1 tablet (60 mg total) by mouth 3 (three) times daily.    Dispense:  270 tablet    Refill:  3   rizatriptan (MAXALT) 10 MG tablet    Sig: Take 1 tablet (10 mg total) by mouth as needed for migraine. May repeat in 2 hours if needed    Dispense:  10 tablet    Refill:  [facility] [address] [place], [place] [address]  Phone [phone] Fax [phone]  I spent over 20 minutes of face-to-face and non-face-to-face time with patient on the  1. Myasthenia gravis ([place])   2. Daytime somnolence   3. Morning headache   4. Migraine without status migrainosus, not intractable, unspecified migraine type   5. Migraine with aura and without status migrainosus, not intractable     diagnosis.  This included previsit chart review, lab review, study review, order entry, electronic health record documentation, patient education on the different diagnostic and therapeutic options, counseling and coordination of care, risks and benefits of management, compliance, or risk factor reduction

## 2022-11-07 LAB — IRON,TIBC AND FERRITIN PANEL
%SAT: 19 % (calc) (ref 16–45)
Ferritin: 96 ng/mL (ref 16–288)
Iron: 62 ug/dL (ref 45–160)
TIBC: 318 mcg/dL (calc) (ref 250–450)

## 2022-11-11 ENCOUNTER — Telehealth: Payer: Self-pay | Admitting: Family Medicine

## 2022-11-11 MED ORDER — RIZATRIPTAN BENZOATE 10 MG PO TABS: 10.0000 mg | ORAL_TABLET | ORAL | 11 refills | Status: DC | PRN

## 2022-11-11 MED ORDER — PYRIDOSTIGMINE BROMIDE 60 MG PO TABS: 60.0000 mg | ORAL_TABLET | Freq: Three times a day (TID) | ORAL | 3 refills | Status: DC

## 2022-11-11 NOTE — Telephone Encounter (Signed)
Error

## 2022-11-12 ENCOUNTER — Encounter: Payer: Self-pay | Admitting: Family Medicine

## 2022-11-12 ENCOUNTER — Ambulatory Visit: Payer: No Typology Code available for payment source | Admitting: Family Medicine

## 2022-11-12 VITALS — BP 144/73 | HR 60 | Temp 96.9°F | Ht 66.0 in | Wt 219.0 lb

## 2022-11-12 DIAGNOSIS — R3129 Other microscopic hematuria: Secondary | ICD-10-CM | POA: Diagnosis not present

## 2022-11-12 DIAGNOSIS — E611 Iron deficiency: Secondary | ICD-10-CM | POA: Diagnosis not present

## 2022-11-12 DIAGNOSIS — I10 Essential (primary) hypertension: Secondary | ICD-10-CM | POA: Diagnosis not present

## 2022-11-12 DIAGNOSIS — R7303 Prediabetes: Secondary | ICD-10-CM | POA: Diagnosis not present

## 2022-11-12 DIAGNOSIS — D649 Anemia, unspecified: Secondary | ICD-10-CM

## 2022-11-12 MED ORDER — METFORMIN HCL ER 500 MG PO TB24: ORAL_TABLET | ORAL | 2 refills | Status: DC

## 2022-11-12 MED ORDER — COMFORT TOUCH BP CUFF/MEDIUM MISC: 1.0000 [IU] | 0 refills | Status: AC

## 2022-11-12 NOTE — Progress Notes (Unsigned)
Established Patient Office Visit   Subjective:  Patient ID: [name], female    DOB: [birth year]  Age: 62 y.o. MRN: [id number]  Chief Complaint  Patient presents with   Advice Only    Discuss labs, would like to go over BP readings.     HPI Encounter Diagnoses  Name Primary?   Essential hypertension Yes   Normocytic anemia    Iron deficiency    Other microscopic hematuria    Prediabetes    For follow-up of hypertension, prediabetes hematuria and low iron.  Next colonoscopy is due in 2027.  She is and has been taking iron without issue.  She has not started Maxide.  Sister and mother with diabetes.  She does have a history of prediabetes. {History (Optional):23778}  Review of Systems  Constitutional: Negative.   HENT: Negative.    Eyes:  Negative for blurred vision, discharge and redness.  Respiratory: Negative.    Cardiovascular: Negative.   Gastrointestinal:  Negative for abdominal pain, blood in stool and melena.  Genitourinary: Negative.  Negative for hematuria.  Musculoskeletal: Negative.  Negative for myalgias.  Skin:  Negative for rash.  Neurological:  Negative for tingling, loss of consciousness and weakness.  Endo/Heme/Allergies:  Negative for polydipsia.     Current Outpatient Medications:    Blood Pressure Monitoring (COMFORT TOUCH BP CUFF/MEDIUM) MISC, 1 Units by Does not apply route as directed., Disp: 1 each, Rfl: 0   lisinopril (ZESTRIL) 20 MG tablet, Take 1 tablet (20 mg total) by mouth daily., Disp: 90 tablet, Rfl: 3   meclizine (ANTIVERT) 12.5 MG tablet, Take 1 tablet (12.5 mg total) by mouth 3 (three) times daily as needed for dizziness., Disp: 40 tablet, Rfl: 1   Menaquinone-7 (K2 PO), Take by mouth., Disp: , Rfl:    metFORMIN (GLUCOPHAGE-XR) 500 MG 24 hr tablet, Take 1 at night prior to bedtime., Disp: 30 tablet, Rfl: 2   metoprolol succinate (TOPROL-XL) 25 MG 24 hr tablet, Take 1 tablet (25 mg total) by mouth daily., Disp: 90 tablet, Rfl: 1    Multiple Vitamins-Minerals (HAIR SKIN NAILS PO), Take by mouth., Disp: , Rfl:    multivitamin (THERAGRAN) per tablet, Take 1 tablet by mouth daily.  , Disp: , Rfl:    pyridostigmine (MESTINON) 60 MG tablet, Take 1 tablet (60 mg total) by mouth 3 (three) times daily., Disp: 270 tablet, Rfl: 3   rizatriptan (MAXALT) 10 MG tablet, Take 1 tablet (10 mg total) by mouth as needed for migraine. May repeat in 2 hours if needed, Disp: 10 tablet, Rfl: 11   triamterene-hydrochlorothiazide (MAXZIDE-25) 37.5-25 MG tablet, Take 1 tablet by mouth daily., Disp: 90 tablet, Rfl: 3   Vitamin D, Ergocalciferol, (DRISDOL) 1.25 MG (50000 UNIT) CAPS capsule, Take 1 capsule (50,000 Units total) by mouth every 7 (seven) days., Disp: 15 capsule, Rfl: 2  Current Facility-Administered Medications:    0.9 %  sodium chloride infusion, 500 mL, Intravenous, Once, Jackquline Denmark, MD   Objective:     BP (!) 152/70 (BP Location: Left Arm, Patient Position: Sitting, Cuff Size: Large)   Pulse 60   Temp (!) 96.9 F (36.1 C) (Temporal)   Ht '5\' 6"'$  (1.676 m)   Wt 219 lb (99.3 kg)   LMP 01/04/2011   SpO2 97%   BMI 35.35 kg/m  BP Readings from Last 3 Encounters:  11/12/22 (!) 152/70  11/06/22 (!) 168/79  11/06/22 (!) 140/84   Wt Readings from Last 3 Encounters:  11/12/22 219 lb (  99.3 kg)  11/06/22 222 lb (100.7 kg)  11/06/22 221 lb 4.8 oz (100.4 kg)      Physical Exam Constitutional:      General: She is not in acute distress.    Appearance: Normal appearance. She is not ill-appearing, toxic-appearing or diaphoretic.  HENT:     Head: Normocephalic and atraumatic.     Right Ear: External ear normal.     Left Ear: External ear normal.  Eyes:     General: No scleral icterus.       Right eye: No discharge.        Left eye: No discharge.     Extraocular Movements: Extraocular movements intact.     Conjunctiva/sclera: Conjunctivae normal.  Pulmonary:     Effort: Pulmonary effort is normal. No respiratory distress.   Skin:    General: Skin is warm and dry.  Neurological:     Mental Status: She is alert and oriented to person, place, and time.  Psychiatric:        Mood and Affect: Mood normal.        Behavior: Behavior normal.      No results found for any visits on 11/12/22.  {Labs (Optional):23779}  The 10-year ASCVD risk score (Arnett DK, et al., 2019) is: 10.3%    Assessment & Plan:   Essential hypertension -     Comfort Touch BP Cuff/Medium; 1 Units by Does not apply route as directed.  Dispense: 1 each; Refill: 0  Normocytic anemia  Iron deficiency  Other microscopic hematuria -     Ambulatory referral to Urology  Prediabetes -     metFORMIN HCl ER; Take 1 at night prior to bedtime.  Dispense: 30 tablet; Refill: 2    Return in about 3 months (around 02/11/2023), or if symptoms worsen or fail to improve.  Will go ahead and start Maxide added to her current regimen.  Will start metformin XL nightly.  Discussed cramping and loose stooling that hopefully will pass in a few weeks.  Urology referral for microhematuria.  History of iron deficiency.  Colonoscopies are up-to-date.  Continue iron therapy.  Advised that she lose weight and exercise.  Libby Maw, MD

## 2022-12-11 ENCOUNTER — Ambulatory Visit: Payer: BC Managed Care – PPO | Admitting: Neurology

## 2023-01-08 ENCOUNTER — Telehealth: Payer: Self-pay

## 2023-01-08 ENCOUNTER — Encounter: Payer: Self-pay | Admitting: Family Medicine

## 2023-01-08 ENCOUNTER — Encounter: Payer: Self-pay | Admitting: Neurology

## 2023-01-08 NOTE — Progress Notes (Signed)
   01/08/2023  Patient ID: [name], female   DOB: [birth year], 62 y.o.   MRN: [id number]  [name] called in response to MyChart message sent encouraging medication review appointment prior to upcoming PCP appointment.  She is experiencing extreme fatigue that she believes is related to myasthenia gravis.  Endorses this is currently so severe she is unable to get out of bed at times.  Verified there have been no medication changes that could be worsening the condition.  Suggested contacted neurology for first available appointment.  Patient states she has attempted to do so but unable to make contact.  Forwarding chart to her neurologist to see if they are able to reach out to schedule patient.  Darlina Guys, PharmD, DPLA

## 2023-01-09 NOTE — Telephone Encounter (Signed)
LVM for patient to cal office back to discuss her mychart message

## 2023-01-13 ENCOUNTER — Telehealth: Payer: Self-pay | Admitting: Neurology

## 2023-01-13 NOTE — Telephone Encounter (Signed)
Sent mychart msg informing pt of follow up made with Sarah per Dr. Jaynee Eagles

## 2023-02-11 ENCOUNTER — Encounter: Payer: Self-pay | Admitting: Family Medicine

## 2023-02-11 ENCOUNTER — Ambulatory Visit: Payer: 59 | Admitting: Family Medicine

## 2023-02-11 VITALS — BP 138/84 | HR 69 | Temp 98.0°F | Ht 66.0 in | Wt 211.8 lb

## 2023-02-11 DIAGNOSIS — E611 Iron deficiency: Secondary | ICD-10-CM

## 2023-02-11 DIAGNOSIS — R7303 Prediabetes: Secondary | ICD-10-CM | POA: Diagnosis not present

## 2023-02-11 NOTE — Progress Notes (Signed)
Established Patient Office Visit   Subjective:  Patient ID: [name], female    DOB: Mar 04, 1961  Age: 62 y.o. MRN: [id number]  Chief Complaint  Patient presents with   Medical Management of Chronic Issues    3 month follow up, concerns about fatigue seems to be improving have started on some supplements. Patient not fasting.     HPI Encounter Diagnoses  Name Primary?   Prediabetes Yes   Iron deficiency    Fatigue is improving.  She has been taking the metformin intermittently.  There has been some loose stool.  She is taking an over-the-counter iron formula.  She is also taking a B12 and vitamin D supplement.   Review of Systems  Constitutional: Negative.   HENT: Negative.    Eyes:  Negative for blurred vision, discharge and redness.  Respiratory: Negative.    Cardiovascular: Negative.   Gastrointestinal:  Negative for abdominal pain.  Genitourinary: Negative.   Musculoskeletal: Negative.  Negative for myalgias.  Skin:  Negative for rash.  Neurological:  Negative for tingling, loss of consciousness and weakness.  Endo/Heme/Allergies:  Negative for polydipsia.     Current Outpatient Medications:    Blood Pressure Monitoring (COMFORT TOUCH BP CUFF/MEDIUM) MISC, 1 Units by Does not apply route as directed., Disp: 1 each, Rfl: 0   lisinopril (ZESTRIL) 20 MG tablet, Take 1 tablet (20 mg total) by mouth daily., Disp: 90 tablet, Rfl: 3   meclizine (ANTIVERT) 12.5 MG tablet, Take 1 tablet (12.5 mg total) by mouth 3 (three) times daily as needed for dizziness., Disp: 40 tablet, Rfl: 1   Menaquinone-7 (K2 PO), Take by mouth., Disp: , Rfl:    metFORMIN (GLUCOPHAGE-XR) 500 MG 24 hr tablet, Take 1 at night prior to bedtime., Disp: 30 tablet, Rfl: 2   metoprolol succinate (TOPROL-XL) 25 MG 24 hr tablet, Take 1 tablet (25 mg total) by mouth daily., Disp: 90 tablet, Rfl: 1   Multiple Vitamins-Minerals (HAIR SKIN NAILS PO), Take by mouth., Disp: , Rfl:    multivitamin (THERAGRAN)  per tablet, Take 1 tablet by mouth daily.  , Disp: , Rfl:    pyridostigmine (MESTINON) 60 MG tablet, Take 1 tablet (60 mg total) by mouth 3 (three) times daily., Disp: 270 tablet, Rfl: 3   rizatriptan (MAXALT) 10 MG tablet, Take 1 tablet (10 mg total) by mouth as needed for migraine. May repeat in 2 hours if needed, Disp: 10 tablet, Rfl: 11   triamterene-hydrochlorothiazide (MAXZIDE-25) 37.5-25 MG tablet, Take 1 tablet by mouth daily., Disp: 90 tablet, Rfl: 3   Vitamin D, Ergocalciferol, (DRISDOL) 1.25 MG (50000 UNIT) CAPS capsule, Take 1 capsule (50,000 Units total) by mouth every 7 (seven) days., Disp: 15 capsule, Rfl: 2  Current Facility-Administered Medications:    0.9 %  sodium chloride infusion, 500 mL, Intravenous, Once, Lynann Bologna, MD   Objective:     BP 138/84 (BP Location: Right Arm, Patient Position: Sitting, Cuff Size: Large)   Pulse 69   Temp 98 F (36.7 C) (Temporal)   Ht 5\' 6"  (1.676 m)   Wt 211 lb 12.8 oz (96.1 kg)   LMP 01/04/2011   SpO2 93%   BMI 34.19 kg/m    Physical Exam Constitutional:      General: She is not in acute distress.    Appearance: Normal appearance. She is not ill-appearing, toxic-appearing or diaphoretic.  HENT:     Head: Normocephalic and atraumatic.     Right Ear: External ear normal.  Left Ear: External ear normal.  Eyes:     General: No scleral icterus.       Right eye: No discharge.        Left eye: No discharge.     Extraocular Movements: Extraocular movements intact.     Conjunctiva/sclera: Conjunctivae normal.  Pulmonary:     Effort: Pulmonary effort is normal. No respiratory distress.  Musculoskeletal:     Right lower leg: No edema.     Left lower leg: No edema.  Skin:    General: Skin is warm and dry.  Neurological:     Mental Status: She is alert and oriented to person, place, and time.  Psychiatric:        Mood and Affect: Mood normal.        Behavior: Behavior normal.      No results found for any visits on  02/11/23.    The 10-year ASCVD risk score (Arnett DK, et al., 2019) is: 7.9%    Assessment & Plan:   Prediabetes -     Hemoglobin A1c  Iron deficiency -     Iron, TIBC and Ferritin Panel    Return in about 3 months (around 05/13/2023).  She will take the metformin for a solid month and let me know if any crampy abdominal pain or loose stools persist.  Otherwise continue current medications and follow-up in 3 months.  Mliss Sax, MD

## 2023-02-12 LAB — HEMOGLOBIN A1C: Hgb A1c MFr Bld: 6.7 % — ABNORMAL HIGH (ref 4.6–6.5)

## 2023-02-12 LAB — IRON,TIBC AND FERRITIN PANEL
%SAT: 17 % (calc) (ref 16–45)
Ferritin: 115 ng/mL (ref 16–288)
Iron: 55 ug/dL (ref 45–160)
TIBC: 322 mcg/dL (calc) (ref 250–450)

## 2023-03-04 ENCOUNTER — Encounter: Payer: Self-pay | Admitting: Neurology

## 2023-03-04 ENCOUNTER — Ambulatory Visit: Payer: No Typology Code available for payment source | Admitting: Neurology

## 2023-03-04 NOTE — Progress Notes (Deleted)
Patient: [name] Date of Birth: 09/09/1961  Reason for Visit: Follow up History from: Patient Primary Neurologist: [name]    ASSESSMENT AND PLAN 62 y.o. year old female   1.  Ocular myasthenia gravis (seronegative) 2.  Chronic migraine headaches 3.  Vertigo 4.  Fatigue   HISTORY OF PRESENT ILLNESS: Today 03/04/23   HISTORY  Dr. Lucia Roach 11/11/2022: Migraines are occ and stable and medication helps. Mestinon works, hasn;t gotten worse, no weakness, no swallowing difficulty, no coughing, no dysphagia, facial strengthis good, no SOB unless really exerting herself, no blurry vision, droopy eyelids, overall feeling strong and well. Mestinon 3x a day, no side effects, if miss a dose can tell, no progression. She does wake often, is fatigued during the day, wants to nod off, doesn't know if she snores, nocturnal/morning headaches, keeps water by the bed because of dry mouth in the morning.   REVIEW OF SYSTEMS: Out of a complete 14 system review of symptoms, the patient complains only of the following symptoms, and all other reviewed systems are negative.  See HPI  ALLERGIES: Allergies  Allergen Reactions   Amlodipine     Fatigue    Codeine Nausea And Vomiting and Other (See Comments)    Dizziness,  rooom spends   Etodolac Nausea Only   Hydrocodone-Acetaminophen Nausea And Vomiting    HOME MEDICATIONS: Outpatient Medications Prior to Visit  Medication Sig Dispense Refill   Blood Pressure Monitoring (COMFORT TOUCH BP CUFF/MEDIUM) MISC 1 Units by Does not apply route as directed. 1 each 0   lisinopril (ZESTRIL) 20 MG tablet Take 1 tablet (20 mg total) by mouth daily. 90 tablet 3   meclizine (ANTIVERT) 12.5 MG tablet Take 1 tablet (12.5 mg total) by mouth 3 (three) times daily as needed for dizziness. 40 tablet 1   Menaquinone-7 (K2 PO) Take by mouth.     metFORMIN (GLUCOPHAGE-XR) 500 MG 24 hr tablet Take 1 at night prior to bedtime. 30 tablet 2   metoprolol succinate  (TOPROL-XL) 25 MG 24 hr tablet Take 1 tablet (25 mg total) by mouth daily. 90 tablet 1   Multiple Vitamins-Minerals (HAIR SKIN NAILS PO) Take by mouth.     multivitamin (THERAGRAN) per tablet Take 1 tablet by mouth daily.       pyridostigmine (MESTINON) 60 MG tablet Take 1 tablet (60 mg total) by mouth 3 (three) times daily. 270 tablet 3   rizatriptan (MAXALT) 10 MG tablet Take 1 tablet (10 mg total) by mouth as needed for migraine. May repeat in 2 hours if needed 10 tablet 11   triamterene-hydrochlorothiazide (MAXZIDE-25) 37.5-25 MG tablet Take 1 tablet by mouth daily. 90 tablet 3   Vitamin D, Ergocalciferol, (DRISDOL) 1.25 MG (50000 UNIT) CAPS capsule Take 1 capsule (50,000 Units total) by mouth every 7 (seven) days. 15 capsule 2   Facility-Administered Medications Prior to Visit  Medication Dose Route Frequency Provider Last Rate Last Admin   0.9 %  sodium chloride infusion  500 mL Intravenous Once Lynann Bologna, MD        PAST MEDICAL HISTORY: Past Medical History:  Diagnosis Date   Allergy    Anemia    on med - caltrate   Anxiety    Blood transfusion    09/2010   Chronic low back pain 09/21/2019   Enlarged heart    no problems per patient   GERD (gastroesophageal reflux disease)    diet -  otc med prn   Headache(784.0)  tx with otc ibuprofen   Hypertension    on meds   Lymphoma (HCC)    ankle   Migraine headache 09/21/2019   Myasthenia gravis    rare - no current problems - if flares just affects eyes -see double- now entire body 05-12-2020   Pre-diabetes    Shortness of breath    with exertion   Vertigo 09/21/2019    PAST SURGICAL HISTORY: Past Surgical History:  Procedure Laterality Date   APPENDECTOMY     age 20 yrs   CESAREAN SECTION     2000 - FTP   COLONOSCOPY     MYOMECTOMY     abd myomectomy 1990's   robotic supracervical hysterectomy  04/14/2011   WISDOM TOOTH EXTRACTION      FAMILY HISTORY: Family History  Problem Relation Age of Onset    Hypertension Mother    Diabetes Mother    Thyroid nodules Mother    Heart disease Mother 32       CHF   Colon polyps Mother    Heart failure Father 84   Hypertension Father    Lung cancer Brother 72   Heart disease Brother 32       CAD/CHF   Cancer Brother        Lung cancer   Cancer Daughter 12       lumphomia    Anesthesia problems Neg Hx    Hypotension Neg Hx    Malignant hyperthermia Neg Hx    Pseudochol deficiency Neg Hx    Crohn's disease Neg Hx    Esophageal cancer Neg Hx    Rectal cancer Neg Hx    Stomach cancer Neg Hx    Colon cancer Neg Hx     SOCIAL HISTORY: Social History   Socioeconomic History   Marital status: Single    Spouse name: Not on file   Number of children: Not on file   Years of education: Not on file   Highest education level: Not on file  Occupational History   Not on file  Tobacco Use   Smoking status: Former    Types: Cigarettes    Quit date: 10/14/1980    Years since quitting: 42.4   Smokeless tobacco: Never  Vaping Use   Vaping Use: Never used  Substance and Sexual Activity   Alcohol use: Yes    Comment: occasionally - 1-2 month - wine; not on a monthly basis   Drug use: No   Sexual activity: Never    Birth control/protection: Surgical    Comment: hyst  Other Topics Concern   Not on file  Social History Narrative   Lives with daughter in own home   Right handed    Drinks 1-2 cups caffeine daily   Social Determinants of Health   Financial Resource Strain: Not on file  Food Insecurity: Not on file  Transportation Needs: Not on file  Physical Activity: Not on file  Stress: Not on file  Social Connections: Not on file  Intimate Partner Violence: Not on file    PHYSICAL EXAM  There were no vitals filed for this visit. There is no height or weight on file to calculate BMI.  Generalized: Well developed, in no acute distress  Neurological examination  Mentation: Alert oriented to time, place, history taking. Follows all  commands speech and language fluent Cranial nerve II-XII: Pupils were equal round reactive to light. Extraocular movements were full, visual field were full on confrontational test. Facial sensation and strength  were normal. Uvula tongue midline. Head turning and shoulder shrug  were normal and symmetric. Motor: The motor testing reveals 5 over 5 strength of all 4 extremities. Good symmetric motor tone is noted throughout.  Sensory: Sensory testing is intact to soft touch on all 4 extremities. No evidence of extinction is noted.  Coordination: Cerebellar testing reveals good finger-nose-finger and heel-to-shin bilaterally.  Gait and station: Gait is normal. Tandem gait is normal. Romberg is negative. No drift is seen.  Reflexes: Deep tendon reflexes are symmetric and normal bilaterally.   DIAGNOSTIC DATA (LABS, IMAGING, TESTING) - I reviewed patient records, labs, notes, testing and imaging myself where available.  Lab Results  Component Value Date   WBC 3.3 (L) 11/06/2022   HGB 11.7 (L) 11/06/2022   HCT 36.4 11/06/2022   MCV 81.6 11/06/2022   PLT 272.0 11/06/2022      Component Value Date/Time   NA 141 11/06/2022 1015   K 3.7 11/06/2022 1015   CL 105 11/06/2022 1015   CO2 28 11/06/2022 1015   GLUCOSE 94 11/06/2022 1015   BUN 18 11/06/2022 1015   CREATININE 0.78 11/06/2022 1015   CREATININE 0.96 08/21/2015 1546   CALCIUM 9.4 11/06/2022 1015   PROT 7.0 11/06/2022 1015   ALBUMIN 4.1 11/06/2022 1015   AST 20 11/06/2022 1015   ALT 15 11/06/2022 1015   ALKPHOS 87 11/06/2022 1015   BILITOT 0.4 11/06/2022 1015   GFRNONAA >60 08/13/2019 1601   GFRAA >60 08/13/2019 1601   Lab Results  Component Value Date   CHOL 162 11/06/2022   HDL 47.90 11/06/2022   LDLCALC 103 (H) 11/06/2022   LDLDIRECT 112.0 03/30/2020   TRIG 58.0 11/06/2022   CHOLHDL 3 11/06/2022   Lab Results  Component Value Date   HGBA1C 6.7 (H) 02/11/2023   Lab Results  Component Value Date   VITAMINB12 429  11/06/2022   Lab Results  Component Value Date   TSH 3.80 11/06/2022    Margie Ege, AGNP-C, DNP 03/04/2023, 5:40 AM [facility] [address] [place], [place] [address] [phone]

## 2023-07-19 ENCOUNTER — Other Ambulatory Visit: Payer: Self-pay | Admitting: Family Medicine

## 2023-07-19 DIAGNOSIS — R7303 Prediabetes: Secondary | ICD-10-CM

## 2023-08-21 ENCOUNTER — Encounter: Payer: Self-pay | Admitting: Family Medicine

## 2023-08-21 ENCOUNTER — Ambulatory Visit: Payer: No Typology Code available for payment source | Admitting: Family Medicine

## 2023-08-21 VITALS — BP 142/78 | HR 73 | Temp 97.9°F | Ht 66.0 in | Wt 210.6 lb

## 2023-08-21 DIAGNOSIS — E611 Iron deficiency: Secondary | ICD-10-CM

## 2023-08-21 DIAGNOSIS — R7303 Prediabetes: Secondary | ICD-10-CM

## 2023-08-21 DIAGNOSIS — I10 Essential (primary) hypertension: Secondary | ICD-10-CM

## 2023-08-21 MED ORDER — METFORMIN HCL ER 500 MG PO TB24
ORAL_TABLET | ORAL | 2 refills | Status: DC
Start: 2023-08-21 — End: 2023-11-17

## 2023-08-21 NOTE — Progress Notes (Signed)
Established Patient Office Visit   Subjective:  Patient ID: [name], female    DOB: 07-29-1961  Age: 62 y.o. MRN: [id number]  Chief Complaint  Patient presents with   Follow-up    3 month f/u    HPI Encounter Diagnoses  Name Primary?   Essential hypertension Yes   Prediabetes    Iron deficiency    For follow-up of above.  Lost to follow-up for the last 3 months.  She has not taken her blood pressure medicine in a couple of days.  At home it is reading about like it is today when she is taking her blood pressure medicine.  She has been out of her metformin for at least 3 weeks.  She is taking an over the counter iron preparation intermittently.  She has been busy at a new job and helping her mother.   Review of Systems  Constitutional: Negative.   HENT: Negative.    Eyes:  Negative for blurred vision, discharge and redness.  Respiratory: Negative.    Cardiovascular: Negative.   Gastrointestinal:  Negative for abdominal pain.  Genitourinary: Negative.   Musculoskeletal: Negative.  Negative for myalgias.  Skin:  Negative for rash.  Neurological:  Negative for tingling, loss of consciousness and weakness.  Endo/Heme/Allergies:  Negative for polydipsia.     Current Outpatient Medications:    Blood Pressure Monitoring (COMFORT TOUCH BP CUFF/MEDIUM) MISC, 1 Units by Does not apply route as directed., Disp: 1 each, Rfl: 0   lisinopril (ZESTRIL) 20 MG tablet, Take 1 tablet (20 mg total) by mouth daily., Disp: 90 tablet, Rfl: 3   meclizine (ANTIVERT) 12.5 MG tablet, Take 1 tablet (12.5 mg total) by mouth 3 (three) times daily as needed for dizziness., Disp: 40 tablet, Rfl: 1   Menaquinone-7 (K2 PO), Take by mouth., Disp: , Rfl:    metoprolol succinate (TOPROL-XL) 25 MG 24 hr tablet, Take 1 tablet (25 mg total) by mouth daily., Disp: 90 tablet, Rfl: 1   Multiple Vitamins-Minerals (HAIR SKIN NAILS PO), Take by mouth., Disp: , Rfl:    multivitamin (THERAGRAN) per tablet, Take 1  tablet by mouth daily.  , Disp: , Rfl:    pyridostigmine (MESTINON) 60 MG tablet, Take 1 tablet (60 mg total) by mouth 3 (three) times daily., Disp: 270 tablet, Rfl: 3   rizatriptan (MAXALT) 10 MG tablet, Take 1 tablet (10 mg total) by mouth as needed for migraine. May repeat in 2 hours if needed, Disp: 10 tablet, Rfl: 11   triamterene-hydrochlorothiazide (MAXZIDE-25) 37.5-25 MG tablet, Take 1 tablet by mouth daily., Disp: 90 tablet, Rfl: 3   Vitamin D, Ergocalciferol, (DRISDOL) 1.25 MG (50000 UNIT) CAPS capsule, Take 1 capsule (50,000 Units total) by mouth every 7 (seven) days., Disp: 15 capsule, Rfl: 2   metFORMIN (GLUCOPHAGE-XR) 500 MG 24 hr tablet, Take 1 at night prior to bedtime., Disp: 30 tablet, Rfl: 2  Current Facility-Administered Medications:    0.9 %  sodium chloride infusion, 500 mL, Intravenous, Once, Lynann Bologna, MD   Objective:     BP (!) 142/78   Pulse 73   Temp 97.9 F (36.6 C) (Temporal)   Ht 5\' 6"  (1.676 m)   Wt 210 lb 9.6 oz (95.5 kg)   LMP 01/04/2011   SpO2 97%   BMI 33.99 kg/m  Wt Readings from Last 3 Encounters:  08/21/23 210 lb 9.6 oz (95.5 kg)  02/11/23 211 lb 12.8 oz (96.1 kg)  11/12/22 219 lb (99.3 kg)  Physical Exam Constitutional:      General: She is not in acute distress.    Appearance: Normal appearance. She is not ill-appearing, toxic-appearing or diaphoretic.  HENT:     Head: Normocephalic and atraumatic.     Right Ear: External ear normal.     Left Ear: External ear normal.     Mouth/Throat:     Mouth: Mucous membranes are moist.     Pharynx: Oropharynx is clear. No oropharyngeal exudate or posterior oropharyngeal erythema.  Eyes:     General: No scleral icterus.       Right eye: No discharge.        Left eye: No discharge.     Extraocular Movements: Extraocular movements intact.     Conjunctiva/sclera: Conjunctivae normal.     Pupils: Pupils are equal, round, and reactive to light.  Cardiovascular:     Rate and Rhythm: Normal  rate and regular rhythm.  Pulmonary:     Effort: Pulmonary effort is normal. No respiratory distress.     Breath sounds: Normal breath sounds.  Musculoskeletal:     Cervical back: No rigidity or tenderness.  Skin:    General: Skin is warm and dry.  Neurological:     Mental Status: She is alert and oriented to person, place, and time.  Psychiatric:        Mood and Affect: Mood normal.        Behavior: Behavior normal.      No results found for any visits on 08/21/23.    The 10-year ASCVD risk score (Arnett DK, et al., 2019) is: 8.5%    Assessment & Plan:   Essential hypertension -     Basic metabolic panel  Prediabetes -     metFORMIN HCl ER; Take 1 at night prior to bedtime.  Dispense: 30 tablet; Refill: 2  Iron deficiency    Return in about 9 weeks (around 10/23/2023).  Advised to take blood pressure medicine daily and purchase her own blood pressure cuff.  Bring it with you next visit.  Information was given on how to take blood pressure as well as managing hypertension.  Take metformin daily.  Continue over-the-counter iron for now.  Will check labs next visit.  Mliss Sax, MD

## 2023-08-22 LAB — BASIC METABOLIC PANEL
BUN: 12 mg/dL (ref 6–23)
CO2: 28 meq/L (ref 19–32)
Calcium: 9.1 mg/dL (ref 8.4–10.5)
Chloride: 106 meq/L (ref 96–112)
Creatinine, Ser: 0.68 mg/dL (ref 0.40–1.20)
GFR: 93.64 mL/min (ref >60.00)
Glucose, Bld: 89 mg/dL (ref 70–99)
Potassium: 3.7 meq/L (ref 3.5–5.1)
Sodium: 141 meq/L (ref 135–145)

## 2023-11-02 ENCOUNTER — Other Ambulatory Visit: Payer: Self-pay | Admitting: Family Medicine

## 2023-11-02 DIAGNOSIS — G43909 Migraine, unspecified, not intractable, without status migrainosus: Secondary | ICD-10-CM

## 2023-11-02 DIAGNOSIS — I10 Essential (primary) hypertension: Secondary | ICD-10-CM

## 2023-11-03 ENCOUNTER — Telehealth: Payer: Self-pay

## 2023-11-03 DIAGNOSIS — E611 Iron deficiency: Secondary | ICD-10-CM

## 2023-11-03 MED ORDER — IRON (FERROUS SULFATE) 325 (65 FE) MG PO TABS
325.0000 mg | ORAL_TABLET | Freq: Every day | ORAL | 1 refills | Status: AC
Start: 2023-11-03 — End: ?

## 2023-11-03 NOTE — Telephone Encounter (Signed)
Rx sent 

## 2023-11-06 ENCOUNTER — Telehealth: Payer: Self-pay | Admitting: Family Medicine

## 2023-11-06 NOTE — Telephone Encounter (Signed)
Pt wanted me to let you know : she took off today for a 2:20 pm appt that was scheduled for her on this past Tuesday 1.25.2025 @ 11:20. The rep told her she would schedule her today since the last opening ( same day ) at 11:00 am had just passed. She was told when she arrives today there was nothingscheduled . The rep also said she would send you her message requesting RX Iron pills . Pt said none of this was done

## 2023-11-08 ENCOUNTER — Other Ambulatory Visit: Payer: Self-pay | Admitting: Family Medicine

## 2023-11-08 DIAGNOSIS — I10 Essential (primary) hypertension: Secondary | ICD-10-CM

## 2023-11-11 ENCOUNTER — Other Ambulatory Visit: Payer: Self-pay | Admitting: Neurology

## 2023-11-11 ENCOUNTER — Encounter: Payer: Self-pay | Admitting: Family Medicine

## 2023-11-11 ENCOUNTER — Ambulatory Visit: Payer: No Typology Code available for payment source | Admitting: Neurology

## 2023-11-11 ENCOUNTER — Ambulatory Visit: Payer: No Typology Code available for payment source | Admitting: Family Medicine

## 2023-11-11 ENCOUNTER — Encounter: Payer: Self-pay | Admitting: Neurology

## 2023-11-11 VITALS — BP 174/86 | HR 69 | Ht 66.0 in | Wt 208.0 lb

## 2023-11-11 VITALS — BP 134/72 | HR 71 | Temp 98.2°F | Ht 66.0 in | Wt 208.2 lb

## 2023-11-11 DIAGNOSIS — D649 Anemia, unspecified: Secondary | ICD-10-CM

## 2023-11-11 DIAGNOSIS — R7303 Prediabetes: Secondary | ICD-10-CM

## 2023-11-11 DIAGNOSIS — R42 Dizziness and giddiness: Secondary | ICD-10-CM

## 2023-11-11 DIAGNOSIS — R5383 Other fatigue: Secondary | ICD-10-CM

## 2023-11-11 DIAGNOSIS — E611 Iron deficiency: Secondary | ICD-10-CM | POA: Diagnosis not present

## 2023-11-11 DIAGNOSIS — I10 Essential (primary) hypertension: Secondary | ICD-10-CM

## 2023-11-11 DIAGNOSIS — G7000 Myasthenia gravis without (acute) exacerbation: Secondary | ICD-10-CM | POA: Diagnosis not present

## 2023-11-11 DIAGNOSIS — G43109 Migraine with aura, not intractable, without status migrainosus: Secondary | ICD-10-CM

## 2023-11-11 DIAGNOSIS — E559 Vitamin D deficiency, unspecified: Secondary | ICD-10-CM

## 2023-11-11 DIAGNOSIS — G43909 Migraine, unspecified, not intractable, without status migrainosus: Secondary | ICD-10-CM | POA: Diagnosis not present

## 2023-11-11 MED ORDER — RIZATRIPTAN BENZOATE 10 MG PO TABS: 10.0000 mg | ORAL_TABLET | ORAL | 11 refills | Status: AC | PRN

## 2023-11-11 MED ORDER — PYRIDOSTIGMINE BROMIDE 60 MG PO TABS: 60.0000 mg | ORAL_TABLET | Freq: Three times a day (TID) | ORAL | 3 refills | Status: AC

## 2023-11-11 MED ORDER — NURTEC 75 MG PO TBDP: 75.0000 mg | ORAL_TABLET | ORAL | 11 refills | Status: DC

## 2023-11-11 NOTE — Progress Notes (Unsigned)
Established Patient Office Visit   Subjective:  Patient ID: [name], female    DOB: 08-31-1961  Age: 63 y.o. MRN: [id number]  Chief Complaint  Patient presents with   Hypertension    Follow up and request to get iron level check, fatigue    Hypertension Associated symptoms include malaise/fatigue. Pertinent negatives include no blurred vision.   Encounter Diagnoses  Name Primary?   Iron deficiency Yes   Prediabetes    Essential hypertension    Normocytic anemia    Vitamin D deficiency    Fatigue, unspecified type    For follow-up of the above.  Has been taking metformin as directed.  She has been experiencing loose stooling but says that it is tolerable.  Blood pressure is controlled with Maxide and lisinopril 20 mg daily.  Myasthenia has been stable.  She is taking iron for history of iron deficiency.  She is status post hysterectomy.  There has been no hematuria.  Has seen no hematochezia or dark tarry stools.  Last colonoscopy was 3 years ago.  There is a strong family history of anemia in her mother and siblings.  She reports fatigue.  She works a Investment banker, corporate an Engineer, site.  She also is caring for her infant mother who has been in and out of the hospital.  Recent TSH was normal.  There is no time for regular exercise.  She denies depression. {History (Optional):23778}  Review of Systems  Constitutional:  Positive for malaise/fatigue.  HENT: Negative.    Eyes:  Negative for blurred vision, discharge and redness.  Respiratory: Negative.    Cardiovascular: Negative.   Gastrointestinal:  Negative for abdominal pain, blood in stool and melena.  Genitourinary: Negative.  Negative for hematuria.  Musculoskeletal: Negative.  Negative for myalgias.  Skin:  Negative for rash.  Neurological:  Negative for tingling, loss of consciousness and weakness.  Endo/Heme/Allergies:  Negative for polydipsia.  Psychiatric/Behavioral:  Negative for depression. The patient  is not nervous/anxious.      Current Outpatient Medications:    Blood Pressure Monitoring (COMFORT TOUCH BP CUFF/MEDIUM) MISC, 1 Units by Does not apply route as directed., Disp: 1 each, Rfl: 0   Iron, Ferrous Sulfate, 325 (65 Fe) MG TABS, Take 325 mg by mouth daily., Disp: 90 tablet, Rfl: 1   lisinopril (ZESTRIL) 20 MG tablet, TAKE 1 TABLET DAILY, Disp: 90 tablet, Rfl: 2   meclizine (ANTIVERT) 12.5 MG tablet, Take 1 tablet (12.5 mg total) by mouth 3 (three) times daily as needed for dizziness., Disp: 40 tablet, Rfl: 1   Menaquinone-7 (K2 PO), Take by mouth., Disp: , Rfl:    metFORMIN (GLUCOPHAGE-XR) 500 MG 24 hr tablet, Take 1 at night prior to bedtime., Disp: 30 tablet, Rfl: 2   metoprolol succinate (TOPROL-XL) 25 MG 24 hr tablet, TAKE 1 TABLET DAILY, Disp: 90 tablet, Rfl: 1   Multiple Vitamins-Minerals (HAIR SKIN NAILS PO), Take by mouth., Disp: , Rfl:    multivitamin (THERAGRAN) per tablet, Take 1 tablet by mouth daily.  , Disp: , Rfl:    pyridostigmine (MESTINON) 60 MG tablet, Take 1 tablet (60 mg total) by mouth 3 (three) times daily., Disp: 270 tablet, Rfl: 3   Rimegepant Sulfate (NURTEC) 75 MG TBDP, Take 1 tablet (75 mg total) by mouth every other day., Disp: 16 tablet, Rfl: 11   rizatriptan (MAXALT) 10 MG tablet, Take 1 tablet (10 mg total) by mouth as needed for migraine. May repeat in 2 hours if needed,  Disp: 10 tablet, Rfl: 11   triamterene-hydrochlorothiazide (MAXZIDE-25) 37.5-25 MG tablet, TAKE 1 TABLET DAILY, Disp: 90 tablet, Rfl: 1   Vitamin D, Ergocalciferol, (DRISDOL) 1.25 MG (50000 UNIT) CAPS capsule, Take 1 capsule (50,000 Units total) by mouth every 7 (seven) days., Disp: 15 capsule, Rfl: 2  Current Facility-Administered Medications:    0.9 %  sodium chloride infusion, 500 mL, Intravenous, Once, Lynann Bologna, MD   Objective:     BP 134/72 (BP Location: Left Arm, Patient Position: Sitting, Cuff Size: Normal)   Pulse 71   Temp 98.2 F (36.8 C)   Ht 5\' 6"  (1.676 m)    Wt 208 lb 3.2 oz (94.4 kg)   LMP 01/04/2011   SpO2 97%   BMI 33.60 kg/m  BP Readings from Last 3 Encounters:  11/11/23 134/72  11/11/23 (!) 174/86  08/21/23 (!) 142/78   Wt Readings from Last 3 Encounters:  11/11/23 208 lb 3.2 oz (94.4 kg)  11/11/23 208 lb (94.3 kg)  08/21/23 210 lb 9.6 oz (95.5 kg)      Physical Exam Constitutional:      General: She is not in acute distress.    Appearance: Normal appearance. She is not ill-appearing, toxic-appearing or diaphoretic.  HENT:     Head: Normocephalic and atraumatic.     Right Ear: External ear normal.     Left Ear: External ear normal.     Mouth/Throat:     Mouth: Mucous membranes are moist.     Pharynx: Oropharynx is clear. No oropharyngeal exudate or posterior oropharyngeal erythema.  Eyes:     General: No scleral icterus.       Right eye: No discharge.        Left eye: No discharge.     Extraocular Movements: Extraocular movements intact.     Conjunctiva/sclera: Conjunctivae normal.     Pupils: Pupils are equal, round, and reactive to light.  Cardiovascular:     Rate and Rhythm: Normal rate and regular rhythm.  Pulmonary:     Effort: Pulmonary effort is normal. No respiratory distress.     Breath sounds: Normal breath sounds.  Musculoskeletal:     Cervical back: No rigidity or tenderness.  Skin:    General: Skin is warm and dry.  Neurological:     Mental Status: She is alert and oriented to person, place, and time.  Psychiatric:        Mood and Affect: Mood normal.        Behavior: Behavior normal.      No results found for any visits on 11/11/23.  {Labs (Optional):23779}  The 10-year ASCVD risk score (Arnett DK, et al., 2019) is: 7.6%    Assessment & Plan:   Iron deficiency -     Iron, TIBC and Ferritin Panel  Prediabetes -     Comprehensive metabolic panel -     Hemoglobin A1c  Essential hypertension -     Comprehensive metabolic panel -     Urinalysis, Routine w reflex  microscopic  Normocytic anemia -     B12 and Folate Panel -     CBC with Differential/Platelet -     Alpha-Thalassemia GenotypR -     Beta Thalassemia: HBB Family -     Multiple Myeloma Panel (SPEP&IFE w/QIG)  Vitamin D deficiency -     VITAMIN D 25 Hydroxy (Vit-D Deficiency, Fractures)  Fatigue, unspecified type    Return in about 6 months (around 05/10/2024), or if symptoms worsen or fail  to improve.  Believe that her fatigue is likely associated with stress of life and chronic disease.  Checking for thalassemia and myeloma.  Rechecking vitamin D levels.    Mliss Sax, MD

## 2023-11-11 NOTE — Patient Instructions (Addendum)
Will try Nurtec 75 mg every other day for migraine prevention, continue Maxalt as needed   Continue the Mestinon  Referral for sleep consult to rule out OSA  Meds ordered this encounter  Medications   Rimegepant Sulfate (NURTEC) 75 MG TBDP    Sig: Take 1 tablet (75 mg total) by mouth every other day.    Dispense:  16 tablet    Refill:  11   rizatriptan (MAXALT) 10 MG tablet    Sig: Take 1 tablet (10 mg total) by mouth as needed for migraine. May repeat in 2 hours if needed    Dispense:  10 tablet    Refill:  11   pyridostigmine (MESTINON) 60 MG tablet    Sig: Take 1 tablet (60 mg total) by mouth 3 (three) times daily.    Dispense:  270 tablet    Refill:  3

## 2023-11-11 NOTE — Progress Notes (Signed)
Patient: [name] Date of Birth: [birth year]  Reason for Visit: Follow up History from: Patient Primary Neurologist: [facility]  ASSESSMENT AND PLAN 63 y.o. year old female with ocular myasthenia and migraines with vertigo.  Also presenting with daytime fatigue, nonrestorative sleep, snoring, morning headache.  -Referral to sleep studies for evaluation of potential OSA -Try Nurtec 75 mg tablet every other day for migraine preventative -Continue Maxalt 10 mg as needed for acute headache -Continue Mestinon 60 mg 3 times daily for ocular myasthenia -Follow-up with me in 6 months via video visit to follow-up on migraines.  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  Meds ordered this encounter  Medications   Rimegepant Sulfate (NURTEC) 75 MG TBDP    Sig: Take 1 tablet (75 mg total) by mouth every other day.    Dispense:  16 tablet    Refill:  11   rizatriptan (MAXALT) 10 MG tablet    Sig: Take 1 tablet (10 mg total) by mouth as needed for migraine. May repeat in 2 hours if needed    Dispense:  10 tablet    Refill:  11   pyridostigmine (MESTINON) 60 MG tablet    Sig: Take 1 tablet (60 mg total) by mouth 3 (three) times daily.    Dispense:  270 tablet    Refill:  3    HISTORY OF PRESENT ILLNESS: Today 11/11/23 When last saw [name] was referred for sleep consult. Continues with fatigue, poor sleeping, day time fatigue, tendency to doze off. Works full time now, takes care of her elderly mother. Migraines are twice a week, often triggered by lights with on site working, gets vertigo with migraines. Trying to get covers on light. Takes Maxalt PRN with good benefit, usually will start with Tylenol. She is used to it at this point. MG is doing great, takes Mestinon 60 mg TID, if she misses a doses, will feel the right eye "pull" start to close, diplopia. Does have GI upset that she has been accustomed to. BP  was increased today, going to see PCP today to adjust BP medication.   HISTORY  [name] 11/11/2022: Migraines are occ and stable and medication helps. Mestinon works, hasn;t gotten worse, no weakness, no swallowing difficulty, no coughing, no dysphagia, facial strengthis good, no SOB unless really exerting herself, no blurry vision, droopy eyelids, overall feeling strong and well. Mestinon 3x a day, no side effects, if miss a dose can tell, no progression. She does wake often, is fatigued during the day, wants to nod off, doesn't know if she snores, nocturnal/morning headaches, keeps water by the bed because of dry mouth in the morning.   REVIEW OF SYSTEMS: Out of a complete 14 system review of symptoms, the patient complains only of the following symptoms, and all other reviewed systems are negative.  See HPI  ALLERGIES: Allergies  Allergen Reactions   Amlodipine     Fatigue    Codeine Nausea And Vomiting and Other (See Comments)    Dizziness,  rooom spends   Etodolac Nausea Only   Hydrocodone-Acetaminophen Nausea And Vomiting    HOME MEDICATIONS: Outpatient Medications Prior to Visit  Medication Sig Dispense Refill   Blood Pressure Monitoring (COMFORT TOUCH BP CUFF/MEDIUM) MISC 1 Units by Does not apply route as directed. 1 each 0   Iron, Ferrous Sulfate, 325 (65 Fe) MG TABS Take 325 mg by mouth daily. 90 tablet  1   lisinopril (ZESTRIL) 20 MG tablet TAKE 1 TABLET DAILY 90 tablet 2   meclizine (ANTIVERT) 12.5 MG tablet Take 1 tablet (12.5 mg total) by mouth 3 (three) times daily as needed for dizziness. 40 tablet 1   Menaquinone-7 (K2 PO) Take by mouth.     metFORMIN (GLUCOPHAGE-XR) 500 MG 24 hr tablet Take 1 at night prior to bedtime. 30 tablet 2   metoprolol succinate (TOPROL-XL) 25 MG 24 hr tablet TAKE 1 TABLET DAILY 90 tablet 1   Multiple Vitamins-Minerals (HAIR SKIN NAILS PO) Take by mouth.     multivitamin (THERAGRAN) per tablet Take 1 tablet by mouth daily.        pyridostigmine (MESTINON) 60 MG tablet Take 1 tablet (60 mg total) by mouth 3 (three) times daily. 270 tablet 3   rizatriptan (MAXALT) 10 MG tablet Take 1 tablet (10 mg total) by mouth as needed for migraine. May repeat in 2 hours if needed 10 tablet 11   triamterene-hydrochlorothiazide (MAXZIDE-25) 37.5-25 MG tablet TAKE 1 TABLET DAILY 90 tablet 1   Vitamin D, Ergocalciferol, (DRISDOL) 1.25 MG (50000 UNIT) CAPS capsule Take 1 capsule (50,000 Units total) by mouth every 7 (seven) days. 15 capsule 2   Facility-Administered Medications Prior to Visit  Medication Dose Route Frequency Provider Last Rate Last Admin   0.9 %  sodium chloride infusion  500 mL Intravenous Once Lynann Bologna, MD        PAST MEDICAL HISTORY: Past Medical History:  Diagnosis Date   Allergy    Anemia    on med - caltrate   Anxiety    Blood transfusion    09/2010   Chronic low back pain 09/21/2019   Enlarged heart    no problems per patient   GERD (gastroesophageal reflux disease)    diet -  otc med prn   Headache(784.0)    tx with otc ibuprofen   Hypertension    on meds   Lymphoma (HCC)    ankle   Migraine headache 09/21/2019   Myasthenia gravis    rare - no current problems - if flares just affects eyes -see double- now entire body 05-12-2020   Pre-diabetes    Shortness of breath    with exertion   Vertigo 09/21/2019    PAST SURGICAL HISTORY: Past Surgical History:  Procedure Laterality Date   APPENDECTOMY     age 73 yrs   CESAREAN SECTION     2000 - FTP   COLONOSCOPY     MYOMECTOMY     abd myomectomy 1990's   robotic supracervical hysterectomy  04/14/2011   WISDOM TOOTH EXTRACTION      FAMILY HISTORY: Family History  Problem Relation Age of Onset   Hypertension Mother    Diabetes Mother    Thyroid nodules Mother    Heart disease Mother 59       CHF   Colon polyps Mother    Heart failure Father 21   Hypertension Father    Lung cancer Brother 37   Heart disease Brother 51        CAD/CHF   Cancer Brother        Lung cancer   Cancer Daughter 12       lumphomia    Anesthesia problems Neg Hx    Hypotension Neg Hx    Malignant hyperthermia Neg Hx    Pseudochol deficiency Neg Hx    Crohn's disease Neg Hx    Esophageal cancer Neg Hx  Rectal cancer Neg Hx    Stomach cancer Neg Hx    Colon cancer Neg Hx     SOCIAL HISTORY: Social History   Socioeconomic History   Marital status: Single    Spouse name: Not on file   Number of children: Not on file   Years of education: Not on file   Highest education level: Not on file  Occupational History   Not on file  Tobacco Use   Smoking status: Former    Current packs/day: 0.00    Types: Cigarettes    Quit date: 10/14/1980    Years since quitting: 43.1   Smokeless tobacco: Never  Vaping Use   Vaping status: Never Used  Substance and Sexual Activity   Alcohol use: Yes    Comment: occasionally - 1-2 month - wine; not on a monthly basis   Drug use: No   Sexual activity: Never    Birth control/protection: Surgical    Comment: hyst  Other Topics Concern   Not on file  Social History Narrative   Lives with daughter in own home   Right handed    Drinks 1-2 cups caffeine daily   Social Drivers of Health   Financial Resource Strain: Not on file  Food Insecurity: Not on file  Transportation Needs: Not on file  Physical Activity: Not on file  Stress: Not on file (11/23/2019)  Social Connections: Not on file  Intimate Partner Violence: Low Risk  (01/28/2020)   Received from [facility]   Intimate Partner Violence    Insults You: Not on file    Threatens You: Not on file    Screams at You: Not on file    Physically Hurt: Not on file    Intimate Partner Violence Score: Not on file   PHYSICAL EXAM  Vitals:   11/11/23 1304 11/11/23 1313  BP: (!) 163/82 (!) 174/86  Pulse: 69 69  Weight: 208 lb (94.3 kg)   Height: 5\' 6"  (1.676 m)    Body mass index is 33.57 kg/m.  Generalized: Well  developed, in no acute distress  Neurological examination  Mentation: Alert oriented to time, place, history taking. Follows all commands speech and language fluent Cranial nerve II-XII: Pupils were equal round reactive to light. Extraocular movements were full, visual field were full on confrontational test. Facial sensation and strength were normal. Head turning and shoulder shrug  were normal and symmetric. Mild eye open weakness.  Motor: The motor testing reveals 5 over 5 strength of all 4 extremities. Good symmetric motor tone is noted throughout.  Sensory: Sensory testing is intact to soft touch on all 4 extremities. No evidence of extinction is noted.  Coordination: Cerebellar testing reveals good finger-nose-finger and heel-to-shin bilaterally.  Gait and station: Gait is normal.  Reflexes: Deep tendon reflexes are symmetric and normal bilaterally.   DIAGNOSTIC DATA (LABS, IMAGING, TESTING) - I reviewed patient records, labs, notes, testing and imaging myself where available.  Lab Results  Component Value Date   WBC 3.3 (L) 11/06/2022   HGB 11.7 (L) 11/06/2022   HCT 36.4 11/06/2022   MCV 81.6 11/06/2022   PLT 272.0 11/06/2022      Component Value Date/Time   NA 141 08/21/2023 1413   K 3.7 08/21/2023 1413   CL 106 08/21/2023 1413   CO2 28 08/21/2023 1413   GLUCOSE 89 08/21/2023 1413   BUN 12 08/21/2023 1413   CREATININE 0.68 08/21/2023 1413   CREATININE 0.96 08/21/2015 1546  CALCIUM 9.1 08/21/2023 1413   PROT 7.0 11/06/2022 1015   ALBUMIN 4.1 11/06/2022 1015   AST 20 11/06/2022 1015   ALT 15 11/06/2022 1015   ALKPHOS 87 11/06/2022 1015   BILITOT 0.4 11/06/2022 1015   GFRNONAA >60 08/13/2019 1601   GFRAA >60 08/13/2019 1601   Lab Results  Component Value Date   CHOL 162 11/06/2022   HDL 47.90 11/06/2022   LDLCALC 103 (H) 11/06/2022   LDLDIRECT 112.0 03/30/2020   TRIG 58.0 11/06/2022   CHOLHDL 3 11/06/2022   Lab Results  Component Value Date   HGBA1C 6.7 (H)  02/11/2023   Lab Results  Component Value Date   VITAMINB12 429 11/06/2022   Lab Results  Component Value Date   TSH 3.80 11/06/2022    Margie Ege, AGNP-C, DNP 11/11/2023, 1:15 PM [facility] [address], [address] [place], [place] [address] [phone]

## 2023-11-12 ENCOUNTER — Telehealth: Payer: Self-pay | Admitting: Neurology

## 2023-11-12 ENCOUNTER — Other Ambulatory Visit: Payer: Self-pay | Admitting: Neurology

## 2023-11-12 LAB — URINALYSIS, ROUTINE W REFLEX MICROSCOPIC
Bilirubin Urine: NEGATIVE
Ketones, ur: NEGATIVE
Leukocytes,Ua: NEGATIVE
Nitrite: NEGATIVE
Specific Gravity, Urine: 1.02 (ref 1.000–1.030)
Total Protein, Urine: NEGATIVE
Urine Glucose: NEGATIVE
Urobilinogen, UA: 0.2 (ref 0.0–1.0)
pH: 6.5 (ref 5.0–8.0)

## 2023-11-12 LAB — CBC WITH DIFFERENTIAL/PLATELET
Basophils Absolute: 0.1 10*3/uL (ref 0.0–0.1)
Basophils Relative: 1.1 % (ref 0.0–3.0)
Eosinophils Absolute: 0 10*3/uL (ref 0.0–0.7)
Eosinophils Relative: 0.6 % (ref 0.0–5.0)
HCT: 38 % (ref 36.0–46.0)
Hemoglobin: 11.9 g/dL — ABNORMAL LOW (ref 12.0–15.0)
Lymphocytes Relative: 43.6 % (ref 12.0–46.0)
Lymphs Abs: 2.2 10*3/uL (ref 0.7–4.0)
MCHC: 31.4 g/dL (ref 30.0–36.0)
MCV: 82.9 fL (ref 78.0–100.0)
Monocytes Absolute: 0.5 10*3/uL (ref 0.1–1.0)
Monocytes Relative: 10.6 % (ref 3.0–12.0)
Neutro Abs: 2.2 10*3/uL (ref 1.4–7.7)
Neutrophils Relative %: 44.1 % (ref 43.0–77.0)
Platelets: 314 10*3/uL (ref 150.0–400.0)
RBC: 4.58 Mil/uL (ref 3.87–5.11)
RDW: 14.3 % (ref 11.5–15.5)
WBC: 5.1 10*3/uL (ref 4.0–10.5)

## 2023-11-12 LAB — COMPREHENSIVE METABOLIC PANEL
ALT: 11 U/L (ref 0–35)
AST: 18 U/L (ref 0–37)
Albumin: 4.1 g/dL (ref 3.5–5.2)
Alkaline Phosphatase: 113 U/L (ref 39–117)
BUN: 17 mg/dL (ref 6–23)
CO2: 26 meq/L (ref 19–32)
Calcium: 9.5 mg/dL (ref 8.4–10.5)
Chloride: 104 meq/L (ref 96–112)
Creatinine, Ser: 0.99 mg/dL (ref 0.40–1.20)
GFR: 61.25 mL/min (ref >60.00)
Glucose, Bld: 105 mg/dL — ABNORMAL HIGH (ref 70–99)
Potassium: 3.7 meq/L (ref 3.5–5.1)
Sodium: 139 meq/L (ref 135–145)
Total Bilirubin: 0.2 mg/dL (ref 0.2–1.2)
Total Protein: 7.3 g/dL (ref 6.0–8.3)

## 2023-11-12 LAB — IRON,TIBC AND FERRITIN PANEL
%SAT: 17 % (ref 16–45)
Ferritin: 89 ng/mL (ref 16–288)
Iron: 53 ug/dL (ref 45–160)
TIBC: 311 ug/dL (ref 250–450)

## 2023-11-12 LAB — B12 AND FOLATE PANEL
Folate: 11.7 ng/mL (ref >5.9)
Vitamin B-12: 408 pg/mL (ref 211–911)

## 2023-11-12 LAB — HEMOGLOBIN A1C: Hgb A1c MFr Bld: 6.6 % — ABNORMAL HIGH (ref 4.6–6.5)

## 2023-11-12 LAB — VITAMIN D 25 HYDROXY (VIT D DEFICIENCY, FRACTURES): VITD: 30.47 ng/mL (ref 30.00–100.00)

## 2023-11-12 NOTE — Telephone Encounter (Signed)
Call to patient, she stated her migraine was worse today. She has not picked up the nurtec as it needed a PA and I sent to the PA team urgently. Advised she could ask pharmacy for coupon code or cash price and patient in agreement. She states she is going to takes Sarah's advise about possibly working from home and if she has any short or long term disability insurance. She stated she didn't go to work today because leg started to draw up this morning but now was fine. She is in agreement to get [place] paper work if she has the policy.I advised I would send to Maralyn Sago and make her aware.

## 2023-11-12 NOTE — Telephone Encounter (Signed)
Pt called wanting to speak to the RN because she has some additional concerns since yesterday's appt. Please advise.

## 2023-11-13 ENCOUNTER — Telehealth: Payer: Self-pay | Admitting: Family Medicine

## 2023-11-13 MED ORDER — VITAMIN D3 125 MCG (5000 UT) PO CAPS: 5000.0000 [IU] | ORAL_CAPSULE | Freq: Every day | ORAL | 2 refills | Status: AC

## 2023-11-13 NOTE — Telephone Encounter (Signed)
[name] from [facility] needs to verify a lab order she needs to know if [name] is the correct order the beta tahalassemia   dr wanted test  for the patient she would like a call back  option 1 then option 3

## 2023-11-13 NOTE — Addendum Note (Signed)
Addended by: Andrez Grime on: 11/13/2023 12:48 PM   Modules accepted: Orders

## 2023-11-14 ENCOUNTER — Other Ambulatory Visit (HOSPITAL_COMMUNITY): Payer: Self-pay

## 2023-11-14 ENCOUNTER — Telehealth: Payer: Self-pay

## 2023-11-14 NOTE — Telephone Encounter (Signed)
I called and spoke with Tobi Bastos and she asked if patient's family were tested and if this needs to be gene sequencing. I told Tobi Bastos that Dr. Doreene Burke was out of the office and she said that office can call her back on Monday to let her know.

## 2023-11-14 NOTE — Telephone Encounter (Signed)
Pharmacy Patient Advocate Encounter   Received notification from RX Request Messages that prior authorization for Nurtec 75MG  dispersible tablets is required/requested.   Insurance verification completed.   The patient is insured through [employer] .   Per test claim: PA required; PA submitted to above mentioned insurance via CoverMyMeds Key/confirmation #/EOC [id number] Status is pending

## 2023-11-15 LAB — BETA THALASSEMIA: HBB FAMILY

## 2023-11-16 ENCOUNTER — Other Ambulatory Visit: Payer: Self-pay | Admitting: Family Medicine

## 2023-11-16 DIAGNOSIS — R7303 Prediabetes: Secondary | ICD-10-CM

## 2023-11-17 NOTE — Telephone Encounter (Signed)
Received a form via Fax requesting additional information-sent form and clinicals to [phone].

## 2023-11-19 NOTE — Telephone Encounter (Signed)
 PA request has been Submitted. New Encounter created for follow up. For additional info see Pharmacy Prior Auth telephone encounter from 11/14/2023.

## 2023-11-21 LAB — SPECIMEN STATUS REPORT

## 2023-11-21 NOTE — Telephone Encounter (Signed)
 Pharmacy Patient Advocate Encounter  Received notification from [facility] that Prior Authorization for Nurtec has been DENIED.  Full denial letter will be uploaded to the media tab. See denial reason below.   PA #/Case ID/Reference #:

## 2023-11-21 NOTE — Telephone Encounter (Signed)
 I sent patient my chart note letting her know that insurance denied Nurtec. They will pay for either Ajovy  or Emgality. If she is agreeable, I will order Ajovy . Thanks

## 2023-11-21 NOTE — Telephone Encounter (Signed)
 [name],  Denied nurtec, they will cover: Ajovy , emgality  Let me know how to proceed. Thanks,  Production assistant, radio

## 2023-11-24 MED ORDER — AJOVY 225 MG/1.5ML ~~LOC~~ SOAJ: 225.0000 mg | SUBCUTANEOUS | 11 refills | Status: DC

## 2023-11-24 NOTE — Telephone Encounter (Signed)
 Ajovy  was sent in.   Meds ordered this encounter  Medications   Fremanezumab -vfrm (AJOVY ) 225 MG/1.5ML SOAJ    Sig: Inject 225 mg into the skin every 30 (thirty) days.    Dispense:  1.68 mL    Refill:  11

## 2023-11-24 NOTE — Telephone Encounter (Signed)
 Called and spoke w/pt and stated that nurtec wouldn't be approved but emgality or ajovy  would be approved pt stated that they are unable to administer them herself. She says send rx and she will see if she can do it. Or have someone inject it for her. She did show hesitancy on needles.   Pt stated that she does plan to send leave of absence forms. Informed her S. Settle would be reaching out to collect the form fee prior to completion and she voiced gratitude and understanding

## 2023-11-24 NOTE — Addendum Note (Signed)
 Addended by: Wess Hammed on: 11/24/2023 12:38 PM   Modules accepted: Orders

## 2023-11-25 ENCOUNTER — Encounter: Payer: Self-pay | Admitting: Neurology

## 2023-11-25 ENCOUNTER — Ambulatory Visit: Payer: No Typology Code available for payment source | Admitting: Neurology

## 2023-11-25 VITALS — BP 160/82 | HR 60 | Ht 66.0 in | Wt 208.0 lb

## 2023-11-25 DIAGNOSIS — R42 Dizziness and giddiness: Secondary | ICD-10-CM

## 2023-11-25 DIAGNOSIS — G43E11 Chronic migraine with aura, intractable, with status migrainosus: Secondary | ICD-10-CM | POA: Insufficient documentation

## 2023-11-25 DIAGNOSIS — Z9189 Other specified personal risk factors, not elsewhere classified: Secondary | ICD-10-CM | POA: Diagnosis not present

## 2023-11-25 DIAGNOSIS — G7000 Myasthenia gravis without (acute) exacerbation: Secondary | ICD-10-CM | POA: Insufficient documentation

## 2023-11-25 DIAGNOSIS — G9332 Myalgic encephalomyelitis/chronic fatigue syndrome: Secondary | ICD-10-CM

## 2023-11-25 DIAGNOSIS — G43509 Persistent migraine aura without cerebral infarction, not intractable, without status migrainosus: Secondary | ICD-10-CM | POA: Diagnosis not present

## 2023-11-25 DIAGNOSIS — Z0289 Encounter for other administrative examinations: Secondary | ICD-10-CM

## 2023-11-25 NOTE — Patient Instructions (Signed)
63 y.o. year old female  here with:     1) photosensitivity, distressed/ HA    2) sleep deprivation and  highest degree of fatigue.    3)  Myasthenia.    4) loud snoring .   I will refer to a HST as she is unable to not sleep well aside from home.  If there is no OSA seen, may not need sleep clinic follow up-    Follow up will be through our NP within 5 months. An appointment is already set up with Margie Ege, NP

## 2023-11-25 NOTE — Progress Notes (Signed)
 SLEEP MEDICINE CLINIC    Provider:  Melvyn Novas, MD  Primary Care Physician:  Mliss Sax, MD [address] [place] [address]     Referring Provider: Dr Naomie Dean -         Chief Complaint according to patient   Patient presents with:     New sleep Patient (Initial Visit)           HISTORY OF PRESENT ILLNESS: SLEEP MEDICINE CLINIC   [name] is a 63 y.o. female patient who is seen upon referral from Dr Lucia Gaskins , who followed  this patient for Myasthenia . She is seen today to  evaluate her for sleep disordered breathing, she sits in a dark exam room and reports  dizziness, nausea.  Seen on on 11/25/2023 .  Chief concern according to patient :  " I have been dizzy, feeling the spinning vertigo and headaches - and sometimes it coincides with myasthenia relapses.  MRI brain has been normal.  She was seen by a dentist who detected a bone growth in her jaw.      I have the pleasure of seeing [name] 11/25/23 a right-handed  AA female with a possible sleep disorder.      Sleep relevant medical history: Nocturia/ 1-2 , tinnitus, cervical spine whiplash, sinusitis,   Family medical /sleep history: Mother with OSA, i   Social history:  Patient is working in Aeronautical engineer, [employer], and lives in a household with daughter ,  caretaker of her mother.  no PETS, The patient currently works office hours. Tobacco use: never .  ETOH use ; none ,  Caffeine intake in form of Coffee( 12ounces  day ) Soda( /) Tea ( rarely /) no energy drinks Exercise in form of  None .    Sleep habits are as follows: The patient's dinner time is between 5-8 PM.  The patient goes to bed at 12 PM - 1 AM and continues to sleep for 4-5.5  hours, wakes for 1-2 bathroom breaks, the first time at 3 AM.   The preferred sleep position is sideways , with the support of 0 pillows.  Dreams are reportedly rare.  The patient wakes up with an alarm.  6 AM is the  usual rise time. She reports not feeling refreshed or restored in AM, with symptoms such as dry mouth, morning headaches, and residual fatigue.  Naps are not  taken, no opportunity.    11/11/2022: Migraines are occ and stable and medication helps. Mestinon works, hasn;t gotten worse, no weakness, no swallowing difficulty, no coughing, no dysphagia, facial strengthis good, no SOB unless really exerting herself, no blurry vision, droopy eyelids, overall feeling strong and well. Mestinon 3x a day, no side effects, if miss a dose can tell, no progression. She does wake often, is fatigued during the day, wants to nod off, doesn't know if she snores, nocturnal/morning headaches, keeps water by the bed because of dry mouth in the morning.    Patient complains of symptoms per HPI as well as the following symptoms: none . Pertinent negatives and positives per HPI. All others negative     10/09/2021: Patient transitioning to me from Dr. Anne Hahn with a hx of stable migraines and ocular myasthenia gravis. She has low energy level, she has low back pain, the mestinon works fr the ocular myasthenia gravis, since 1995. She had a sleep test at least 10 years ago, she has  gained 20 pounds, discussed sleep apnea can cause fatigue, she declines a sleep test, discussed sequelae of untreated sleep apnea, she says it was a waste of time and declines. She has felt fatigued for years. She has low back pain and unclear why. She had an MRI low back. She has more aching and not weakness so not likely myasthenia gravis. Recommend Dr. Doreene Burke a rheumatologic evaluation. Mom and brother have RF. Ongoing for year and more joint pain. Overall the mestinon 3x a day fixes her eye problems. Migraines episodic, maxalt helps acutely.    MRI brain normal 06/2020: normal personally reviewed images   Review of Systems: Out of a complete 14 system review, the patient complains of only the following symptoms, and all other reviewed systems are  negative.:  Fatigue, sleepiness , snoring, fragmented sleep,   HA ; the patient sometimes goes to bed with headaches and wakes up in the morning with headaches again but she can also be woken up by headaches.  The headache character has shifted.   How likely are you to doze in the following situations: 0 = not likely, 1 = slight chance, 2 = moderate chance, 3 = high chance   Sitting and Reading? Watching Television? Sitting inactive in a public place (theater or meeting)? As a passenger in a car for an hour without a break? Lying down in the afternoon when circumstances permit? Sitting and talking to someone? Sitting quietly after lunch without alcohol? In a car, while stopped for a few minutes in traffic?   Total = 8/ 24 points   FSS endorsed at 63/ 63 points.  GDS 6 / 15 points, clinically depressed.   Social History   Socioeconomic History   Marital status: Single    Spouse name: Not on file   Number of children: Not on file   Years of education: Not on file   Highest education level: Not on file  Occupational History   Not on file  Tobacco Use   Smoking status: Former    Current packs/day: 0.00    Types: Cigarettes    Quit date: 10/14/1980    Years since quitting: 43.1   Smokeless tobacco: Never  Vaping Use   Vaping status: Never Used  Substance and Sexual Activity   Alcohol use: Yes    Comment: occasionally - 1-2 month - wine; not on a monthly basis   Drug use: No   Sexual activity: Never    Birth control/protection: Surgical    Comment: hyst  Other Topics Concern   Not on file  Social History Narrative   Lives with daughter in own home   Right handed    Drinks 1-2 cups caffeine daily   Pt works    Social Drivers of Corporate investment banker Strain: Not on file  Food Insecurity: Not on file  Transportation Needs: Not on file  Physical Activity: Not on file  Stress: Not on file (11/23/2019)  Social Connections: Not on file    Family History  Problem  Relation Age of Onset   Hypertension Mother    Diabetes Mother    Thyroid nodules Mother    Heart disease Mother 48       CHF   Colon polyps Mother    Heart failure Father 93   Hypertension Father    Lung cancer Brother 49   Heart disease Brother 93       CAD/CHF   Cancer Brother  Lung cancer   Cancer Daughter 74       lumphomia    Anesthesia problems Neg Hx    Hypotension Neg Hx    Malignant hyperthermia Neg Hx    Pseudochol deficiency Neg Hx    Crohn's disease Neg Hx    Esophageal cancer Neg Hx    Rectal cancer Neg Hx    Stomach cancer Neg Hx    Colon cancer Neg Hx    Sleep apnea Neg Hx     Past Medical History:  Diagnosis Date   Allergy    Anemia    on med - caltrate   Anxiety    Blood transfusion    09/2010   Chronic low back pain 09/21/2019   Enlarged heart    no problems per patient   GERD (gastroesophageal reflux disease)    diet -  otc med prn   Headache(784.0)    tx with otc ibuprofen   Hypertension    on meds   Lymphoma (HCC)    ankle   Migraine headache 09/21/2019   Myasthenia gravis    rare - no current problems - if flares just affects eyes -see double- now entire body 05-12-2020   Pre-diabetes    Shortness of breath    with exertion   Vertigo 09/21/2019    Past Surgical History:  Procedure Laterality Date   APPENDECTOMY     age 27 yrs   CESAREAN SECTION     2000 - FTP   COLONOSCOPY     MYOMECTOMY     abd myomectomy 1990's   robotic supracervical hysterectomy  04/14/2011   WISDOM TOOTH EXTRACTION       Current Outpatient Medications on File Prior to Visit  Medication Sig Dispense Refill   Blood Pressure Monitoring (COMFORT TOUCH BP CUFF/MEDIUM) MISC 1 Units by Does not apply route as directed. 1 each 0   Cholecalciferol (VITAMIN D3) 125 MCG (5000 UT) CAPS Take 1 capsule (5,000 Units total) by mouth daily. 90 capsule 2   Fremanezumab-vfrm (AJOVY) 225 MG/1.5ML SOAJ Inject 225 mg into the skin every 30 (thirty) days. 1.68 mL 11    Iron, Ferrous Sulfate, 325 (65 Fe) MG TABS Take 325 mg by mouth daily. 90 tablet 1   lisinopril (ZESTRIL) 20 MG tablet TAKE 1 TABLET DAILY 90 tablet 2   meclizine (ANTIVERT) 12.5 MG tablet Take 1 tablet (12.5 mg total) by mouth 3 (three) times daily as needed for dizziness. 40 tablet 1   Menaquinone-7 (K2 PO) Take by mouth.     metFORMIN (GLUCOPHAGE-XR) 500 MG 24 hr tablet TAKE 1 AT NIGHT PRIOR TO BEDTIME. 90 tablet 0   metoprolol succinate (TOPROL-XL) 25 MG 24 hr tablet TAKE 1 TABLET DAILY 90 tablet 1   Multiple Vitamins-Minerals (HAIR SKIN NAILS PO) Take by mouth.     multivitamin (THERAGRAN) per tablet Take 1 tablet by mouth daily.       NURTEC 75 MG TBDP TAKE 1 TABLET (75 MG TOTAL) BY MOUTH EVERY OTHER DAY 16 tablet 11   pyridostigmine (MESTINON) 60 MG tablet Take 1 tablet (60 mg total) by mouth 3 (three) times daily. 270 tablet 3   rizatriptan (MAXALT) 10 MG tablet Take 1 tablet (10 mg total) by mouth as needed for migraine. May repeat in 2 hours if needed 10 tablet 11   triamterene-hydrochlorothiazide (MAXZIDE-25) 37.5-25 MG tablet TAKE 1 TABLET DAILY 90 tablet 1   Current Facility-Administered Medications on File Prior to Visit  Medication Dose Route  Frequency Provider Last Rate Last Admin   0.9 %  sodium chloride infusion  500 mL Intravenous Once Lynann Bologna, MD        Allergies  Allergen Reactions   Amlodipine     Fatigue    Codeine Nausea And Vomiting and Other (See Comments)    Dizziness,  rooom spends   Etodolac Nausea Only   Hydrocodone-Acetaminophen Nausea And Vomiting     DIAGNOSTIC DATA (LABS, IMAGING, TESTING) - I reviewed patient records, labs, notes, testing and imaging myself where available.  Lab Results  Component Value Date   WBC 5.1 11/11/2023   HGB 11.9 (L) 11/11/2023   HCT 38.0 11/11/2023   MCV 82.9 11/11/2023   PLT 314.0 11/11/2023      Component Value Date/Time   NA 139 11/11/2023 1628   K 3.7 11/11/2023 1628   CL 104 11/11/2023 1628   CO2  26 11/11/2023 1628   GLUCOSE 105 (H) 11/11/2023 1628   BUN 17 11/11/2023 1628   CREATININE 0.99 11/11/2023 1628   CREATININE 0.96 08/21/2015 1546   CALCIUM 9.5 11/11/2023 1628   PROT 7.3 11/11/2023 1628   PROT 6.9 11/11/2023 1628   ALBUMIN 4.1 11/11/2023 1628   AST 18 11/11/2023 1628   ALT 11 11/11/2023 1628   ALKPHOS 113 11/11/2023 1628   BILITOT 0.2 11/11/2023 1628   GFRNONAA >60 08/13/2019 1601   GFRAA >60 08/13/2019 1601   Lab Results  Component Value Date   CHOL 162 11/06/2022   HDL 47.90 11/06/2022   LDLCALC 103 (H) 11/06/2022   LDLDIRECT 112.0 03/30/2020   TRIG 58.0 11/06/2022   CHOLHDL 3 11/06/2022   Lab Results  Component Value Date   HGBA1C 6.6 (H) 11/11/2023   Lab Results  Component Value Date   VITAMINB12 408 11/11/2023   Lab Results  Component Value Date   TSH 3.80 11/06/2022    PHYSICAL EXAM:  Today's Vitals   11/25/23 1504  BP: (!) 160/82  Pulse: 60  Weight: 208 lb (94.3 kg)  Height: 5\' 6"  (1.676 m)   Body mass index is 33.57 kg/m.   Wt Readings from Last 3 Encounters:  11/25/23 208 lb (94.3 kg)  11/11/23 208 lb 3.2 oz (94.4 kg)  11/11/23 208 lb (94.3 kg)     Ht Readings from Last 3 Encounters:  11/25/23 5\' 6"  (1.676 m)  11/11/23 5\' 6"  (1.676 m)  11/11/23 5\' 6"  (1.676 m)      General: The patient is awake, alert and appears not in acute distress. The patient is well groomed. Head: Normocephalic, atraumatic. Neck is supple.  Mallampati 3,  neck circumference:15 inches . Nasal airflow  patent.  - there is an unusual bone formation seen under her gums Dental status: biological  Cardiovascular:  Regular rate and cardiac rhythm by pulse,  without distended neck veins. Respiratory: Lungs are clear to auscultation.  Skin:  Without evidence of ankle edema, or rash. Trunk: The patient's posture is erect.   NEUROLOGIC EXAM: The patient is awake and alert, oriented to place and time.   Memory subjective described as intact.  Attention  span & concentration ability appears normal.  Speech is fluent,  without  dysarthria, dysphonia or aphasia.  Mood and affect are appropriate.   Cranial nerves: no loss of smell or taste reported  Pupils are equal and briskly reactive to light. Funduscopic exam deferred.  Extraocular movements in vertical and horizontal planes were intact and without nystagmus. No Diplopia. Visual fields by finger perimetry  are intact. Hearing was intact to soft voice and finger rubbing.    Facial sensation intact to fine touch.  Facial motor strength is symmetric and tongue and uvula move midline.  Neck ROM : rotation, tilt and flexion extension were normal for age and shoulder shrug was symmetrical.    Motor exam:  Symmetric bulk, tone and ROM.   Normal tone without cog- wheeling, symmetric grip strength .   Sensory:  Fine touch vibration were normal.  Proprioception tested in the upper extremities was normal.   Coordination: Rapid alternating movements in the fingers/hands were of normal speed.  The Finger-to-nose maneuver was intact without evidence of ataxia, dysmetria or tremor.   Gait and station: Patient could rise unassisted from a seated position, walked without assistive device.   Toe and heel walk were deferred.  Deep tendon reflexes: in the  upper and lower extremities are symmetric and intact.  Babinski response was deferred.    ASSESSMENT AND PLAN 63 y.o. year old female  Patient of dr Trevor Mace here with: MYASTHEBIA< STATUS MIGRAINOSUS< and SNORING,MORNING HEADACHES     1) photosensitivity, distressed/ HA   2) sleep deprivation and  highest degree of fatigue.   3)  Myasthenia.   4) loud snoring .  I will refer to a HST as she is unable to not sleep well aside from home.  If there is no OSA seen, may not need sleep clinic follow up-   Follow up will be through our NP within 5 months. An appointment is already set up with Margie Ege, NP   I would like to thank Mliss Sax, Md [address] [place],  [place] [address] for allowing me to meet with and to take care of this pleasant patient.   After spending a total time of  45  minutes face to face and additional time for physical and neurologic examination, review of laboratory studies,  personal review of imaging studies, reports and results of other testing and review of referral information / records as far as provided in visit,   Electronically signed by: Melvyn Novas, MD 11/25/2023 3:33 PM  [facility] and [facility] Sleep Board certified by The [employer] of Sleep Medicine and Diplomate of the [employer] of Sleep Medicine. Board certified In Neurology through the ABPN, Fellow of the [employer] of Neurology.

## 2023-11-26 ENCOUNTER — Telehealth: Payer: Self-pay

## 2023-11-26 NOTE — Telephone Encounter (Signed)
Called and spoke to pt to clarify what pt needed from fmla paperwork. She is asking for accomodations to stay working from home as she is currently doing due to migraines. Pt stated that the lights in the insurance office are to bright and at home she can work in the dark. Pt stated that she also would need fmla for 1-2days off for migraine episodes monthly.   Pt stated that Dr. Vickey Huger stated not to do ajovy based on her analysis and to do nurtec instead.   I am currently working on form and bringing back to sarah's office asap

## 2023-11-26 NOTE — Telephone Encounter (Signed)
Fmla form completed and signed by slack in np as requested by pt

## 2023-11-26 NOTE — Telephone Encounter (Signed)
Insurance denied Nurtec. They will cover Ajovy or Emgality. Will let Dr. Vickey Huger know that and let the patient know that. Thanks

## 2023-11-27 ENCOUNTER — Telehealth: Payer: Self-pay | Admitting: *Deleted

## 2023-11-27 NOTE — Telephone Encounter (Signed)
Pt accommodation form faxed on 11/27/2023

## 2023-12-16 ENCOUNTER — Ambulatory Visit: Payer: No Typology Code available for payment source | Admitting: Neurology

## 2023-12-16 ENCOUNTER — Telehealth: Payer: Self-pay | Admitting: Neurology

## 2023-12-16 DIAGNOSIS — G43509 Persistent migraine aura without cerebral infarction, not intractable, without status migrainosus: Secondary | ICD-10-CM

## 2023-12-16 DIAGNOSIS — G7000 Myasthenia gravis without (acute) exacerbation: Secondary | ICD-10-CM

## 2023-12-16 DIAGNOSIS — G9332 Myalgic encephalomyelitis/chronic fatigue syndrome: Secondary | ICD-10-CM

## 2023-12-16 DIAGNOSIS — G43E11 Chronic migraine with aura, intractable, with status migrainosus: Secondary | ICD-10-CM

## 2023-12-16 DIAGNOSIS — G4733 Obstructive sleep apnea (adult) (pediatric): Secondary | ICD-10-CM | POA: Diagnosis not present

## 2023-12-16 DIAGNOSIS — Z9189 Other specified personal risk factors, not elsewhere classified: Secondary | ICD-10-CM

## 2023-12-16 NOTE — Telephone Encounter (Signed)
 Contacted pt back, LVM per DPR, informing her she may be able to go to a local vision center that supplies shades/glasses to get the type she is looking for. Advised she can also look on [place]. Number provided to call back with questions.

## 2023-12-16 NOTE — Telephone Encounter (Signed)
 When patient came by the office this morning to pick up the home sleep study device. She stated she wanted to know where you can get wrap around glasses that help with light sensitivity because she works from home and is having problem with the light and her migraine.

## 2023-12-17 ENCOUNTER — Telehealth: Payer: Self-pay

## 2023-12-17 NOTE — Progress Notes (Signed)
 [facility]  [name] 63 year old female 04/30/1961   HOME SLEEP TEST REPORT ( by [facility] PAT)   STUDY DATE:  12-16-2023   ORDERING CLINICIAN: Melvyn Novas, MD  REFERRING CLINICIAN:  Dr Kremer/ Dr Lucia Gaskins    CLINICAL INFORMATION/HISTORY:  Today 11/11/23 When last saw [name] was referred for sleep consult. Continues with fatigue, poor sleeping, day time fatigue, tendency to doze off. Works full time now, takes care of her elderly mother. Migraines are twice a week, often triggered by lights with on site working, gets vertigo with migraines. Trying to get covers on light. Takes Maxalt PRN with good benefit, usually will start with Tylenol. She is used to it at this point. MG is doing great, takes Mestinon 60 mg TID, if she misses a doses, will feel the right eye "pull" start to close, diplopia. Does have GI upset that she has been accustomed to. BP was increased today, going to see PCP today to adjust BP medication.    HISTORY  [name] 11/11/2022: Myasthenia/ Migraines are occ and stable and medication helps. Mestinon works, hasn;t gotten worse, no weakness, no swallowing difficulty, no coughing, no dysphagia, facial strengthis good, no SOB unless really exerting herself, no blurry vision, droopy eyelids, overall feeling strong and well. Mestinon 3x a day, no side effects, if miss a dose can tell, no progression. She does wake often, is fatigued during the day, wants to nod off, doesn't know if she snores, nocturnal/morning headaches, keeps water by the bed because of dry mouth in the morning.        Epworth sleepiness score:  8/ 24 points   FSS endorsed at 63/ 63 points.  GDS 6 / 15 points, clinically depressed.    BMI: 33.5  kg/m   Neck Circumference: 15"    FINDINGS:   Sleep Summary:   Total Recording Time (hours, min):     8 hours 21 minutes   Total Sleep Time (hours, min):          6 hours 50 minutes       Percent REM (%):       30.3%                                  Respiratory Indices by AASM criteria:   Calculated pAHI (per hour):   8/h                          REM pAHI:     21.4/h                                            NREM pAHI:    2.1/h                          Positional AHI: The patient slept for the majority of the night in supine position 219 minutes with an AHI of 10.9/h there were also 120 minutes in left lateral position and here the AHI reached only 1/h.  There was REM sleep recorded in lateral and supine position so the driving force here is not REM sleep but a positional dependency.  Snoring reached a mean volume of 41 dB and was present for 17% of  total sleep time- this is mild snoring.                                                  Oxygen Saturation Statistics:   Oxygen Saturation (%) Mean:     96%          Minimum oxygen saturation (%):       86%  O2 Saturation Range (%):    Between a nadir at 86% and the maximum 100% oxygen saturation.                                   O2 Saturation (minutes) <89%:     Less than 1 minute      Pulse Rate Statistics:   Pulse Mean (bpm):       60 bpm          Pulse Range: Varied between 52 and 97 bpm.               IMPRESSION:  This HST confirms the presence of very mild all obstructive sleep apnea that appeared first REM sleep dependent but on second look was dependent on supine sleep position.  I would recommend to avoid sleeping on the back as REM sleep during lateral sleep did not find itself associated with a high AHI.  Snoring was mild.   RECOMMENDATION: Due to the strong positional component that this mild sleep apnea overall depended on I do not recommend positive airway pressure unless the patient cannot manage to avoid the supine sleep position. A follow with  the sleep clinic is not needed.     INTERPRETING PHYSICIAN:   Melvyn Novas, MD  [facility] Sleep Board certified by The [employer] of Sleep Medicine and Diplomate of the  [employer] Sleep Medicine. Board certified In Neurology through the ABPN, Fellow of the [employer] of Neurology.

## 2023-12-17 NOTE — Telephone Encounter (Signed)
 VM was left for patient to return HST device no later than 3pm. VM did explain if device was not returned patient was at risk of being charged $300 for the device.

## 2023-12-24 ENCOUNTER — Telehealth: Payer: Self-pay | Admitting: Neurology

## 2023-12-24 NOTE — Telephone Encounter (Signed)
 Pt called wanting to know what the update is on the PA for her Nurtec. Pt states that she is finished with the samples that she was given and is in needing to know what is the next step. Please advise.

## 2023-12-24 NOTE — Telephone Encounter (Signed)
 Returned pt's call regarding Nurtec PA. Pt was informed that her insurance is not wanting to cover Nurtec until she tries CGRP first either Ajovy or Emgality. I informed pt that Margie Ege had prescribe her Ajovy to try since it is covered by her insurance. Pt stated that because of all of her health problems including myasthenia gravis, diabetes, heart problems and tumor on her leg she is scared to try this because of possible side effects that can cause her. Pt also stated that Dr Vickey Huger also advised her against the  Ajovy since it would cause the constriction of blood vessels and cause her more problems than alleviate the migraines. Pt was asked if she would like to try the Emgality instead, pt stated that both injections are similar and may cause side effects the same. I advised the pt that I was going to send this information to Maralyn Sago for her to review and I would get back with her once I have a response.

## 2023-12-24 NOTE — Telephone Encounter (Signed)
 Patient reporting Dr. Vickey Huger advised her not to start Ajovy or Emgality at her sleep study consult.  I had ordered Nurtec but her insurance denied it.  They are requiring her to try Ajovy or Emgality. Does Dr. Vickey Huger have any recommendation for the patient? Thanks

## 2023-12-25 ENCOUNTER — Encounter: Payer: Self-pay | Admitting: Neurology

## 2023-12-25 NOTE — Telephone Encounter (Signed)
 Lvm 1st attempt by hf 12/25/23

## 2023-12-25 NOTE — Procedures (Signed)
 [facility]  [name] 63 year old female 04/30/1961   HOME SLEEP TEST REPORT ( by Watch PAT)   STUDY DATE:  12-16-2023   ORDERING CLINICIAN: Melvyn Novas, MD  REFERRING CLINICIAN:  Dr Kremer/ Dr Lucia Gaskins    CLINICAL INFORMATION/HISTORY:  Today 11/11/23 When last saw [name] was referred for sleep consult. Continues with fatigue, poor sleeping, day time fatigue, tendency to doze off. Works full time now, takes care of her elderly mother. Migraines are twice a week, often triggered by lights with on site working, gets vertigo with migraines. Trying to get covers on light. Takes Maxalt PRN with good benefit, usually will start with Tylenol. She is used to it at this point. MG is doing great, takes Mestinon 60 mg TID, if she misses a doses, will feel the right eye "pull" start to close, diplopia. Does have GI upset that she has been accustomed to. BP was increased today, going to see PCP today to adjust BP medication.    HISTORY  [name] 11/11/2022: Myasthenia/ Migraines are occ and stable and medication helps. Mestinon works, hasn;t gotten worse, no weakness, no swallowing difficulty, no coughing, no dysphagia, facial strengthis good, no SOB unless really exerting herself, no blurry vision, droopy eyelids, overall feeling strong and well. Mestinon 3x a day, no side effects, if miss a dose can tell, no progression. She does wake often, is fatigued during the day, wants to nod off, doesn't know if she snores, nocturnal/morning headaches, keeps water by the bed because of dry mouth in the morning.        Epworth sleepiness score:  8/ 24 points   FSS endorsed at 63/ 63 points.  GDS 6 / 15 points, clinically depressed.    BMI: 33.5  kg/m   Neck Circumference: 15"    FINDINGS:   Sleep Summary:   Total Recording Time (hours, min):     8 hours 21 minutes   Total Sleep Time (hours, min):          6 hours 50 minutes       Percent REM (%):       30.3%                                  Respiratory Indices by AASM criteria:   Calculated pAHI (per hour):   8/h                          REM pAHI:     21.4/h                                            NREM pAHI:    2.1/h                          Positional AHI: The patient slept for the majority of the night in supine position 219 minutes with an AHI of 10.9/h there were also 120 minutes in left lateral position and here the AHI reached only 1/h.  There was REM sleep recorded in lateral and supine position so the driving force here is not REM sleep but a positional dependency.  Snoring reached a mean volume of 41 dB and was present for 17% of  total sleep time- this is mild snoring.                                                  Oxygen Saturation Statistics:   Oxygen Saturation (%) Mean:     96%          Minimum oxygen saturation (%):       86%  O2 Saturation Range (%):    Between a nadir at 86% and the maximum 100% oxygen saturation.                                   O2 Saturation (minutes) <89%:     Less than 1 minute      Pulse Rate Statistics:   Pulse Mean (bpm):       60 bpm          Pulse Range: Varied between 52 and 97 bpm.               IMPRESSION:  This HST confirms the presence of very mild all obstructive sleep apnea that appeared first REM sleep dependent but on second look was dependent on supine sleep position.  I would recommend to avoid sleeping on the back as REM sleep during lateral sleep did not find itself associated with a high AHI.  Snoring was mild.   RECOMMENDATION: Due to the strong positional component that this mild sleep apnea overall depended on I do not recommend positive airway pressure unless the patient cannot manage to avoid the supine sleep position. A follow with  the sleep clinic is not needed.     INTERPRETING PHYSICIAN:   Melvyn Novas, MD  [facility] Sleep Board certified by The [employer] of Sleep Medicine and Diplomate of the  [employer]. Board certified In Neurology through the ABPN, Fellow of the [employer] of Neu[employer].

## 2023-12-29 NOTE — Telephone Encounter (Signed)
 Called and relayed the information and she stated she wants to know which one (ajovy/emgality) that Dr. Vickey Huger prefers.   Routing to Provider for their recommendation

## 2023-12-30 NOTE — Telephone Encounter (Signed)
 Called the patient and there was no answer. LVM advising the insurance is not covering nurtec. They want her to try emgality or ajovy first. Either would be fine since we already know that Is what her insurance prefers. The ajovy may be better to start with as it doesn't require a loading dose of 2 injections.

## 2024-01-01 NOTE — Telephone Encounter (Signed)
 Called patient to discuss sleep study results. No answer at this time. LVM for the patient to call back.  Advised I would leave a mychart message as well

## 2024-01-01 NOTE — Telephone Encounter (Signed)
-----   Message from Coram Dohmeier sent at 12/25/2023  6:19 PM EDT ----- This patient's apnea (by AASM ) criteria was very mild and not associated with hypoxia. This was strongly supine sleep dependent , and the patient would be easiest treated by avoiding supine sleep.   Snoring can be treated with a dental device if such treatment is desired.  CPAP can be used, but would only be necessary if  non-supine sleep could not be done.  Please note that under Medicare/CMS criteria the AHI would be only 4.1/h and wouldn't allow for the dx of OSA.

## 2024-01-13 NOTE — Telephone Encounter (Signed)
 Called pt and LVM to give [place] a call back for sleep results.

## 2024-01-15 NOTE — Telephone Encounter (Signed)
 Called pt and LVM to give [place] a call back for sleep results. 2x

## 2024-02-18 ENCOUNTER — Encounter: Payer: Self-pay | Admitting: Neurology

## 2024-02-18 ENCOUNTER — Other Ambulatory Visit: Payer: Self-pay | Admitting: Family Medicine

## 2024-02-18 ENCOUNTER — Telehealth: Payer: Self-pay | Admitting: Neurology

## 2024-02-18 ENCOUNTER — Telehealth (INDEPENDENT_AMBULATORY_CARE_PROVIDER_SITE_OTHER): Payer: Self-pay | Admitting: Neurology

## 2024-02-18 DIAGNOSIS — R7303 Prediabetes: Secondary | ICD-10-CM

## 2024-02-18 DIAGNOSIS — G43E11 Chronic migraine with aura, intractable, with status migrainosus: Secondary | ICD-10-CM

## 2024-02-18 NOTE — Telephone Encounter (Signed)
 Pt called stating that she is having a really bad migraine that is also causing double vision and she would like to be advised on what she can do to alleviate the pain since what she has been taking is not working for her pain.

## 2024-02-18 NOTE — Telephone Encounter (Signed)
 Call to patient, offered VV today at 3:45. Left message to call back

## 2024-02-18 NOTE — Progress Notes (Unsigned)
 Patient: [name] Date of Birth: [birth year]  Reason for Visit: Follow up History from: Patient Primary Neurologist: [facility]/ [facility]  ASSESSMENT AND PLAN 63 y.o. year old female with ocular myasthenia and migraines with vertigo.  Also presenting with daytime fatigue, nonrestorative sleep, snoring, morning headache.  -Referral to sleep studies for evaluation of potential OSA -Try Nurtec 75 mg tablet every other day for migraine preventative -Continue Maxalt  10 mg as needed for acute headache -Continue Mestinon  60 mg 3 times daily for ocular myasthenia -Follow-up with me in 6 months via video visit to follow-up on migraines.  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  No orders of the defined types were placed in this encounter.   HISTORY OF PRESENT ILLNESS: Today 02/18/24 At last visit, referral for sleep consult, Nurtec every other day for migraine prevention, continue Maxalt , Mestinon .  Insurance would not cover Nurtec, they would cover Emgality or Ajovy .  Had sleep consult that showed very mild sleep apnea, advised nonsupine sleep.   Update 11/11/23 SS: When last saw Dr. Tresia Fruit was referred for sleep consult. Continues with fatigue, poor sleeping, day time fatigue, tendency to doze off. Works full time now, takes care of her elderly mother. Migraines are twice a week, often triggered by lights with on site working, gets vertigo with migraines. Trying to get covers on light. Takes Maxalt  PRN with good benefit, usually will start with Tylenol . She is used to it at this point. MG is doing great, takes Mestinon  60 mg TID, if she misses a doses, will feel the right eye "pull" start to close, diplopia. Does have GI upset that she has been accustomed to. BP was increased today, going to see PCP today to adjust BP medication.   HISTORY  Dr. Tresia Fruit 11/11/2022: Migraines are occ and stable and medication helps. Mestinon  works,  hasn;t gotten worse, no weakness, no swallowing difficulty, no coughing, no dysphagia, facial strengthis good, no SOB unless really exerting herself, no blurry vision, droopy eyelids, overall feeling strong and well. Mestinon  3x a day, no side effects, if miss a dose can tell, no progression. She does wake often, is fatigued during the day, wants to nod off, doesn't know if she snores, nocturnal/morning headaches, keeps water  by the bed because of dry mouth in the morning.   REVIEW OF SYSTEMS: Out of a complete 14 system review of symptoms, the patient complains only of the following symptoms, and all other reviewed systems are negative.  See HPI  ALLERGIES: Allergies  Allergen Reactions   Amlodipine      Fatigue    Codeine Nausea And Vomiting and Other (See Comments)    Dizziness,  rooom spends   Etodolac Nausea Only   Hydrocodone-Acetaminophen  Nausea And Vomiting    HOME MEDICATIONS: Outpatient Medications Prior to Visit  Medication Sig Dispense Refill   Blood Pressure Monitoring (COMFORT TOUCH BP CUFF/MEDIUM) MISC 1 Units by Does not apply route as directed. 1 each 0   Cholecalciferol (VITAMIN D3) 125 MCG (5000 UT) CAPS Take 1 capsule (5,000 Units total) by mouth daily. 90 capsule 2   Fremanezumab -vfrm (AJOVY ) 225 MG/1.5ML SOAJ Inject 225 mg into the skin every 30 (thirty) days. 1.68 mL 11   Iron , Ferrous Sulfate , 325 (65 Fe) MG TABS Take 325 mg by mouth daily. 90 tablet 1   lisinopril  (ZESTRIL ) 20 MG tablet TAKE 1 TABLET DAILY 90 tablet 2   meclizine  (ANTIVERT ) 12.5 MG tablet  Take 1 tablet (12.5 mg total) by mouth 3 (three) times daily as needed for dizziness. 40 tablet 1   Menaquinone-7 (K2 PO) Take by mouth.     metFORMIN  (GLUCOPHAGE -XR) 500 MG 24 hr tablet TAKE 1 AT NIGHT PRIOR TO BEDTIME. 90 tablet 0   metoprolol  succinate (TOPROL -XL) 25 MG 24 hr tablet TAKE 1 TABLET DAILY 90 tablet 1   Multiple Vitamins-Minerals (HAIR SKIN NAILS PO) Take by mouth.     multivitamin (THERAGRAN)  per tablet Take 1 tablet by mouth daily.       pyridostigmine  (MESTINON ) 60 MG tablet Take 1 tablet (60 mg total) by mouth 3 (three) times daily. 270 tablet 3   rizatriptan  (MAXALT ) 10 MG tablet Take 1 tablet (10 mg total) by mouth as needed for migraine. May repeat in 2 hours if needed 10 tablet 11   triamterene -hydrochlorothiazide  (MAXZIDE-25) 37.5-25 MG tablet TAKE 1 TABLET DAILY 90 tablet 1   Facility-Administered Medications Prior to Visit  Medication Dose Route Frequency Provider Last Rate Last Admin   0.9 %  sodium chloride  infusion  500 mL Intravenous Once Lajuan Pila, MD        PAST MEDICAL HISTORY: Past Medical History:  Diagnosis Date   Allergy    Anemia    on med - caltrate   Anxiety    Blood transfusion    09/2010   Chronic low back pain 09/21/2019   Enlarged heart    no problems per patient   GERD (gastroesophageal reflux disease)    diet -  otc med prn   Headache(784.0)    tx with otc ibuprofen    Hypertension    on meds   Lymphoma (HCC)    ankle   Migraine headache 09/21/2019   Myasthenia gravis    rare - no current problems - if flares just affects eyes -see double- now entire body 05-12-2020   Pre-diabetes    Shortness of breath    with exertion   Vertigo 09/21/2019    PAST SURGICAL HISTORY: Past Surgical History:  Procedure Laterality Date   APPENDECTOMY     age 37 yrs   CESAREAN SECTION     2000 - FTP   COLONOSCOPY     MYOMECTOMY     abd myomectomy 1990's   robotic supracervical hysterectomy  04/14/2011   WISDOM TOOTH EXTRACTION      FAMILY HISTORY: Family History  Problem Relation Age of Onset   Hypertension Mother    Diabetes Mother    Thyroid  nodules Mother    Heart disease Mother 8       CHF   Colon polyps Mother    Heart failure Father 10   Hypertension Father    Lung cancer Brother 25   Heart disease Brother 8       CAD/CHF   Cancer Brother        Lung cancer   Cancer Daughter 12       lumphomia    Anesthesia problems  Neg Hx    Hypotension Neg Hx    Malignant hyperthermia Neg Hx    Pseudochol deficiency Neg Hx    Crohn's disease Neg Hx    Esophageal cancer Neg Hx    Rectal cancer Neg Hx    Stomach cancer Neg Hx    Colon cancer Neg Hx    Sleep apnea Neg Hx     SOCIAL HISTORY: Social History   Socioeconomic History   Marital status: Single    Spouse name: Not  on file   Number of children: Not on file   Years of education: Not on file   Highest education level: Not on file  Occupational History   Not on file  Tobacco Use   Smoking status: Former    Current packs/day: 0.00    Types: Cigarettes    Quit date: 10/14/1980    Years since quitting: 43.3   Smokeless tobacco: Never  Vaping Use   Vaping status: Never Used  Substance and Sexual Activity   Alcohol use: Yes    Comment: occasionally - 1-2 month - wine; not on a monthly basis   Drug use: No   Sexual activity: Never    Birth control/protection: Surgical    Comment: hyst  Other Topics Concern   Not on file  Social History Narrative   Lives with daughter in own home   Right handed    Drinks 1-2 cups caffeine daily   Pt works    Social Drivers of Corporate investment banker Strain: Not on file  Food Insecurity: Not on file  Transportation Needs: Not on file  Physical Activity: Not on file  Stress: Not on file (11/23/2019)  Social Connections: Not on file  Intimate Partner Violence: Low Risk  (01/28/2020)   Received [facility]   Intimate Partner Violence    Insults You: Not on file    Threatens You: Not on file    Screams at You: Not on file    Physically Hurt: Not on file    Intimate Partner Violence Score: Not on file   PHYSICAL EXAM  There were no vitals filed for this visit.  There is no height or weight on file to calculate BMI.  Generalized: Well developed, in no acute distress  Neurological examination  Mentation: Alert oriented to time, place, history taking. Follows all commands speech  and language fluent Cranial nerve II-XII: Pupils were equal round reactive to light. Extraocular movements were full, visual field were full on confrontational test. Facial sensation and strength were normal. Head turning and shoulder shrug  were normal and symmetric. Mild eye open weakness.  Motor: The motor testing reveals 5 over 5 strength of all 4 extremities. Good symmetric motor tone is noted throughout.  Sensory: Sensory testing is intact to soft touch on all 4 extremities. No evidence of extinction is noted.  Coordination: Cerebellar testing reveals good finger-nose-finger and heel-to-shin bilaterally.  Gait and station: Gait is normal.  Reflexes: Deep tendon reflexes are symmetric and normal bilaterally.   DIAGNOSTIC DATA (LABS, IMAGING, TESTING) - I reviewed patient records, labs, notes, testing and imaging myself where available.  Lab Results  Component Value Date   WBC 5.1 11/11/2023   HGB 11.9 (L) 11/11/2023   HCT 38.0 11/11/2023   MCV 82.9 11/11/2023   PLT 314.0 11/11/2023      Component Value Date/Time   NA 139 11/11/2023 1628   K 3.7 11/11/2023 1628   CL 104 11/11/2023 1628   CO2 26 11/11/2023 1628   GLUCOSE 105 (H) 11/11/2023 1628   BUN 17 11/11/2023 1628   CREATININE 0.99 11/11/2023 1628   CREATININE 0.96 08/21/2015 1546   CALCIUM 9.5 11/11/2023 1628   PROT 7.3 11/11/2023 1628   PROT 6.9 11/11/2023 1628   ALBUMIN 4.1 11/11/2023 1628   AST 18 11/11/2023 1628   ALT 11 11/11/2023 1628   ALKPHOS 113 11/11/2023 1628   BILITOT 0.2 11/11/2023 1628   GFRNONAA >60 08/13/2019 1601   GFRAA >  60 08/13/2019 1601   Lab Results  Component Value Date   CHOL 162 11/06/2022   HDL 47.90 11/06/2022   LDLCALC 103 (H) 11/06/2022   LDLDIRECT 112.0 03/30/2020   TRIG 58.0 11/06/2022   CHOLHDL 3 11/06/2022   Lab Results  Component Value Date   HGBA1C 6.6 (H) 11/11/2023   Lab Results  Component Value Date   VITAMINB12 408 11/11/2023   Lab Results  Component Value Date    TSH 3.80 11/06/2022    Jeanmarie Millet, AGNP-C, DNP 02/18/2024, 3:28 PM [facility] [address], [address] [place], [place] [address] [phone]

## 2024-02-18 NOTE — Telephone Encounter (Signed)
 Called and scheduled video visit today at 345

## 2024-03-05 LAB — MULTIPLE MYELOMA PANEL, SERUM
Albumin SerPl Elph-Mcnc: 3.6 g/dL (ref 2.9–4.4)
Albumin/Glob SerPl: 1.1 (ref 0.7–1.7)
Alpha 1: 0.2 g/dL (ref 0.0–0.4)
Alpha2 Glob SerPl Elph-Mcnc: 0.9 g/dL (ref 0.4–1.0)
B-Globulin SerPl Elph-Mcnc: 1.1 g/dL (ref 0.7–1.3)
Gamma Glob SerPl Elph-Mcnc: 1.2 g/dL (ref 0.4–1.8)
Globulin, Total: 3.3 g/dL (ref 2.2–3.9)
IgA/Immunoglobulin A, Serum: 210 mg/dL (ref 87–352)
IgG (Immunoglobin G), Serum: 1289 mg/dL (ref 586–1602)
IgM (Immunoglobulin M), Srm: 39 mg/dL (ref 26–217)
Total Protein: 6.9 g/dL (ref 6.0–8.5)

## 2024-03-05 LAB — BETA-THALASSEMIA: HBB (FULL GENE SEQUENCING)

## 2024-03-05 LAB — ALPHA-THALASSEMIA GENOTYPR

## 2024-04-30 ENCOUNTER — Other Ambulatory Visit: Payer: Self-pay | Admitting: Family Medicine

## 2024-04-30 DIAGNOSIS — I10 Essential (primary) hypertension: Secondary | ICD-10-CM

## 2024-04-30 DIAGNOSIS — G43909 Migraine, unspecified, not intractable, without status migrainosus: Secondary | ICD-10-CM

## 2024-05-05 ENCOUNTER — Encounter: Payer: Self-pay | Admitting: Neurology

## 2024-05-05 ENCOUNTER — Ambulatory Visit: Admitting: Neurology

## 2024-05-05 VITALS — BP 148/86 | HR 73 | Ht 66.0 in | Wt 207.0 lb

## 2024-05-05 DIAGNOSIS — G7000 Myasthenia gravis without (acute) exacerbation: Secondary | ICD-10-CM

## 2024-05-05 DIAGNOSIS — G43009 Migraine without aura, not intractable, without status migrainosus: Secondary | ICD-10-CM

## 2024-05-05 MED ORDER — NURTEC 75 MG PO TBDP: 75.0000 mg | ORAL_TABLET | Freq: Every day | ORAL | 12 refills | Status: AC | PRN

## 2024-05-05 NOTE — Progress Notes (Signed)
 Reason for visit: Ocular myasthenia gravis, migraine headache  [name] is an 63 y.o. female  05/05/2024: Since seeing patient in January 2024, she is seen by nurse practitioner Lauraine Born, I reviewed notes from Lauraine she presented in January 2025 with migraines with vertigo, daytime fatigue, nonrestorative sleep, snoring and morning headaches, we had referred her for sleep evaluation Lauraine Born the beginning of this year referred for sleep studies, continued her on Nurtec, Maxalt , Mestinon .  She did see Dr. Chalice in February of this year. The migraines are bad, the vertigo is bad, light and noise sensitivity instant migraine, 6 migraine days a month and < 10 total headache days a month. No aura. No medication overuse. On the right side of the head, pulsating/pounding/throbbing. MG stable, no new symptoms, no prox weakness/generalization, no worsening ocular symptoms. Pain all over body  From a thorough review of records and patient report, Medications tried that can be used in migraine/headache management greater than 3 months include: Lifestyle modification, headache diaries, better sleep hygiene, exercise, management of migraine triggers, OTC and prescribed analgesics/nsaids such as ibuprofen , excedrin, alleve and others, amlodipine , Ajovy  and the other cgrp medications contraindicated due to uncontrolled HTN, Aimovig contraindicated due to constipation, labetalol , nurtec worked well, lisinopril , metoprolol , maxalt , imitrex, topiramate ,nortriptyline/amitriptyline, imitrex, maxalt , SSRIs with side effects, triptans contraindicated due to uncontrolled blood pressure.   11/11/2022: Migraines are occ and stable and medication helps. Mestinon  works, hasn;t gotten worse, no weakness, no swallowing difficulty, no coughing, no dysphagia, facial strengthis good, no SOB unless really exerting herself, no blurry vision, droopy eyelids, overall feeling strong and well. Mestinon  3x a day, no side  effects, if miss a dose can tell, no progression. She does wake often, is fatigued during the day, wants to nod off, doesn't know if she snores, nocturnal/morning headaches, keeps water  by the bed because of dry mouth in the morning.  I reviewed sleep evaluation overall the study was mild and strongly related to position and how she sleeps and they recommended avoiding sleeping on the back.  Patient complains of symptoms per HPI as well as the following symptoms: none . Pertinent negatives and positives per HPI. All others negative   10/09/2021: Patient transitioning to me from Dr. Jenel with a hx of stable migraines and ocular myasthenia gravis. She has low energy level, she has low back pain, the mestinon  works fr the ocular myasthenia gravis, since 1995. She had a sleep test at least 10 years ago, she has gained 20 pounds, discussed sleep apnea can cause fatigue, she declines a sleep test, discussed sequelae of untreated sleep apnea, she says it was a waste of time and declines. She has felt fatigued for years. She has low back pain and unclear why. She had an MRI low back. She has more aching and not weakness so not likely myasthenia gravis. Recommend Dr. Berneta a rheumatologic evaluation. Mom and brother have RF. Ongoing for year and more joint pain. Overall the mestinon  3x a day fixes her eye problems. Migraines episodic, maxalt  helps acutely.   MRI brain normal 06/2020: normal personally reviewed images   03/2020: TSH, B!2 was normal.  09/21/2020: 0.09 AChR Binding Ab Vitamin D  25.57 (discussed she has Vi D def see Dr. Berneta)  Patient complains of symptoms per HPI as well as the following symptoms: fatigue . Pertinent negatives and positives per HPI. All others negative   History of present illness:  Ms. [name] is a 63 year old right-handed black female with a  history of ocular myasthenia gravis treated with Mestinon .  The patient has gained good improvement with the Mestinon  taking 60 mg 3  times daily.  The patient has had problems with migraine, she was placed on Topamax  but could not really tolerate the medication well as it caused cognitive clouding.  The patient however has had improvement in her headaches and vertigo, she only takes the Maxalt  if needed, she is also on a beta-blocker currently.  The patient is noting some problems with arthritic pain in the hips off and on.  She overall is doing much better.  Past Medical History:  Diagnosis Date   Allergy    Anemia    on med - caltrate   Anxiety    Blood transfusion    09/2010   Chronic low back pain 09/21/2019   Enlarged heart    no problems per patient   GERD (gastroesophageal reflux disease)    diet -  otc med prn   Headache(784.0)    tx with otc ibuprofen    Hypertension    on meds   Lymphoma (HCC)    ankle   Migraine headache 09/21/2019   Myasthenia gravis    rare - no current problems - if flares just affects eyes -see double- now entire body 05-12-2020   Pre-diabetes    Shortness of breath    with exertion   Vertigo 09/21/2019    Past Surgical History:  Procedure Laterality Date   APPENDECTOMY     age 41 yrs   CESAREAN SECTION     2000 - FTP   COLONOSCOPY     MYOMECTOMY     abd myomectomy 1990's   robotic supracervical hysterectomy  04/14/2011   WISDOM TOOTH EXTRACTION      Family History  Problem Relation Age of Onset   Hypertension Mother    Diabetes Mother    Thyroid  nodules Mother    Heart disease Mother 10       CHF   Colon polyps Mother    Heart failure Father 68   Hypertension Father    Lung cancer Brother 61   Heart disease Brother 31       CAD/CHF   Cancer Brother        Lung cancer   Cancer Daughter 12       lumphomia    Anesthesia problems Neg Hx    Hypotension Neg Hx    Malignant hyperthermia Neg Hx    Pseudochol deficiency Neg Hx    Crohn's disease Neg Hx    Esophageal cancer Neg Hx    Rectal cancer Neg Hx    Stomach cancer Neg Hx    Colon cancer Neg Hx     Sleep apnea Neg Hx     Social history:  reports that she quit smoking about 43 years ago. Her smoking use included cigarettes. She has never used smokeless tobacco. She reports current alcohol use. She reports that she does not use drugs.    Allergies  Allergen Reactions   Amlodipine      Fatigue    Codeine Nausea And Vomiting and Other (See Comments)    Dizziness,  rooom spends   Etodolac Nausea Only   Hydrocodone-Acetaminophen  Nausea And Vomiting   Outpatient Encounter Medications as of 05/05/2024  Medication Sig   Blood Pressure Monitoring (COMFORT TOUCH BP CUFF/MEDIUM) MISC 1 Units by Does not apply route as directed.   Cholecalciferol (VITAMIN D3) 125 MCG (5000 UT) CAPS Take 1 capsule (5,000 Units total)  by mouth daily.   Fremanezumab -vfrm (AJOVY ) 225 MG/1.5ML SOAJ Inject 225 mg into the skin every 30 (thirty) days.   Iron , Ferrous Sulfate , 325 (65 Fe) MG TABS Take 325 mg by mouth daily.   lisinopril  (ZESTRIL ) 20 MG tablet TAKE 1 TABLET DAILY   meclizine  (ANTIVERT ) 12.5 MG tablet Take 1 tablet (12.5 mg total) by mouth 3 (three) times daily as needed for dizziness.   Menaquinone-7 (K2 PO) Take by mouth.   metFORMIN  (GLUCOPHAGE -XR) 500 MG 24 hr tablet TAKE 1 AT NIGHT PRIOR TO BEDTIME.   metoprolol  succinate (TOPROL -XL) 25 MG 24 hr tablet TAKE 1 TABLET DAILY   Multiple Vitamins-Minerals (HAIR SKIN NAILS PO) Take by mouth.   multivitamin (THERAGRAN) per tablet Take 1 tablet by mouth daily.     pyridostigmine  (MESTINON ) 60 MG tablet Take 1 tablet (60 mg total) by mouth 3 (three) times daily.   Rimegepant Sulfate (NURTEC) 75 MG TBDP Take 1 tablet (75 mg total) by mouth daily as needed. For migraines. Take as close to onset of migraine as possible. One daily maximum.   rizatriptan  (MAXALT ) 10 MG tablet Take 1 tablet (10 mg total) by mouth as needed for migraine. May repeat in 2 hours if needed   triamterene -hydrochlorothiazide  (MAXZIDE-25) 37.5-25 MG tablet TAKE 1 TABLET DAILY    Facility-Administered Encounter Medications as of 05/05/2024  Medication   0.9 %  sodium chloride  infusion      Physical exam: Exam: Gen: NAD, conversant      CV: No palpitations or chest pain or SOB. VS: Breathing at a normal rate. Weight appears within normal limits. Not febrile. Eyes: Conjunctivae clear without exudates or hemorrhage  Neuro: Detailed Neurologic Exam  Speech:    Speech is normal; fluent and spontaneous with normal comprehension.  Cognition:    The patient is oriented to person, place, and time;     recent and remote memory intact;     language fluent;     normal attention, concentration, fund of knowledge Cranial Nerves:    The pupils are equal, round, and reactive to light. Visual fields are full Extraocular movements are intact.  The face is symmetric with normal sensation. The palate elevates in the midline. Hearing intact. Voice is normal. Shoulder shrug is normal. The tongue has normal motion without fasciculations.   Coordination: normal  Gait:    No abnormalities noted or reported  Motor Observation:   no involuntary movements noted. Tone:    Appears normal  Posture:    Posture is normal. normal erect    Strength:    Strength is anti-gravity and symmetric in the upper and lower limbs.      Sensation: intact to LT, no reports of numbness or tingling or paresthesias         Assessment/Plan: 63 year old patient, I am taking over for Dr. Jenel. Stable Ocular Myasthenia Gravis and migraines and vertigo. New issue of chronic fatigue.   1.  Ocular myasthenia gravis, seronegative: stable, Ocular, not generalized, has had for decades at this point very unlikely to generalize after so many years, continue mestinon , stabe  2.  Migraine headache, vertigo: stable, episodic, cont maxalt    [facility] [address] [place], [place] [address]  Phone [phone] Fax [phone]  I spent 30 minutes of  face-to-face and non-face-to-face time with patient on the  1. Migraine without aura and without status migrainosus, not intractable   2. Ocular myasthenia (HCC)      diagnosis.  This  included previsit chart review, lab review, study review, order entry, electronic health record documentation, patient education on the different diagnostic and therapeutic options, counseling and coordination of care, risks and benefits of management, compliance, or risk factor reduction

## 2024-05-11 ENCOUNTER — Telehealth: Payer: Self-pay | Admitting: Family Medicine

## 2024-05-11 NOTE — Telephone Encounter (Signed)
 Copied from CRM [id number]. Topic: Appointments - Appointment Scheduling >> May 10, 2024  4:55 PM Viola F wrote: Patient called to clarify the last date of her physical, she knows it was 11/11/23 but it was billed as a office visit so she is confused, She will be uploading a form to MyChart for provider to sign regarding the physical Please advise. She has been waiting for a call back to clarify dates. Please call her at [phone] (M)

## 2024-05-26 ENCOUNTER — Telehealth: Payer: No Typology Code available for payment source | Admitting: Neurology

## 2024-06-11 ENCOUNTER — Encounter: Payer: Self-pay | Admitting: Neurology

## 2024-06-15 DIAGNOSIS — Z0289 Encounter for other administrative examinations: Secondary | ICD-10-CM

## 2024-07-08 NOTE — Telephone Encounter (Signed)
 Have been made aware that patient paid the form fee. Fmla form completed, signed by Dr Gregg on behalf of Sarah NP and Dr Ines, and sent to medical records for processing.

## 2024-07-08 NOTE — Telephone Encounter (Signed)
 Received completed FMLA paperwork. Paperwork was faxed to number on form and confirmation received. Original and copy placed in MR office

## 2024-07-29 ENCOUNTER — Other Ambulatory Visit: Payer: Self-pay | Admitting: Family Medicine

## 2024-07-29 DIAGNOSIS — I10 Essential (primary) hypertension: Secondary | ICD-10-CM

## 2024-10-13 ENCOUNTER — Telehealth: Payer: Self-pay | Admitting: *Deleted

## 2024-10-13 ENCOUNTER — Telehealth: Payer: Self-pay

## 2024-10-13 ENCOUNTER — Other Ambulatory Visit (HOSPITAL_COMMUNITY): Payer: Self-pay

## 2024-10-13 NOTE — Telephone Encounter (Signed)
 Pharmacy Patient Advocate Encounter   Received notification from Physician's Office that prior authorization for Nurtec is required/requested.   Insurance verification completed.   The patient is insured through [facility].   Per test claim: PA required; PA started via CoverMyMeds. [employer] [id number] . Waiting for clinical questions to populate.

## 2024-10-13 NOTE — Telephone Encounter (Signed)
 Pharmacy Patient Advocate Encounter  Received notification from [employer] [facility] that Prior Authorization for Nurtec has been DENIED.  Full denial letter will be uploaded to the media tab. See denial reason below.   PA #/Case ID/Reference #: [id number]

## 2024-10-13 NOTE — Telephone Encounter (Signed)
 Call taken from phone room, pt having a migraine/myasthenia ? Spell.  She was at work and noted that she started having dizziness/ double vision/ migraine headache , had to leave work, in her parking lot, cannot drive.  I told her to call 911 for evaluation.  She said she did not want to do that, would call her daughter.  She said she gets these attacks.  The nurtec samples have worked well. I told her that nurtec, rizatriptan  (she has prescriptions available, but is nonformulary.  From what I see PA denied due to her needing to try ajovy /emgality.  From Dr. Avelina note [phone] it states ajovy  and other cgrp med contraindication due to uncontrolled HTN, aimovig constipation.  Her pharmacy stated it was nonformulary.  I will see if pa can be done and making sure that info was offered as in note.  Pt to get home, I told her if worsening sx to seek care urgent care or ED.  Daughter is at home.  I made appt with Dr. Chalice per pt request on 10-21-2024 at 1130.

## 2024-10-13 NOTE — Telephone Encounter (Signed)
 Clinical questions have been answered and PA submitted. PA currently Pending. Please be advised that most companies allow up to 30 days to make a decision. We will advise when a determination has been made, or follow up in 1 week.   Please reach out to our team, Rx Prior Auth Pool, if you haven't heard back in a week.

## 2024-10-18 NOTE — Telephone Encounter (Addendum)
 I called pt LMVM for her and relayed that was following up on call from last week.  Nurtec denied. (Needing to try injectables).  Pt has appt 10-21-2024 with Dr. Chalice.

## 2024-10-20 ENCOUNTER — Other Ambulatory Visit: Payer: Self-pay | Admitting: Family Medicine

## 2024-10-20 DIAGNOSIS — I10 Essential (primary) hypertension: Secondary | ICD-10-CM

## 2024-10-20 DIAGNOSIS — G43909 Migraine, unspecified, not intractable, without status migrainosus: Secondary | ICD-10-CM

## 2024-10-21 ENCOUNTER — Ambulatory Visit: Admitting: Neurology

## 2024-10-21 ENCOUNTER — Encounter: Payer: Self-pay | Admitting: Neurology

## 2024-10-21 VITALS — BP 132/86 | HR 74 | Ht 66.0 in | Wt 215.4 lb

## 2024-10-21 DIAGNOSIS — R42 Dizziness and giddiness: Secondary | ICD-10-CM

## 2024-10-21 DIAGNOSIS — G43809 Other migraine, not intractable, without status migrainosus: Secondary | ICD-10-CM | POA: Diagnosis not present

## 2024-10-21 DIAGNOSIS — G7000 Myasthenia gravis without (acute) exacerbation: Secondary | ICD-10-CM

## 2024-10-21 DIAGNOSIS — G43E11 Chronic migraine with aura, intractable, with status migrainosus: Secondary | ICD-10-CM | POA: Diagnosis not present

## 2024-10-21 MED ORDER — EMGALITY 120 MG/ML ~~LOC~~ SOAJ: 240.0000 mg | Freq: Once | SUBCUTANEOUS | Status: AC

## 2024-10-21 MED ORDER — NURTEC 75 MG PO TBDP: ORAL_TABLET | ORAL | 0 refills | Status: AC

## 2024-10-21 MED ORDER — EMGALITY 120 MG/ML ~~LOC~~ SOSY: 1.0000 mL | PREFILLED_SYRINGE | SUBCUTANEOUS | 11 refills | Status: AC

## 2024-10-21 NOTE — Addendum Note (Signed)
 Addended by: SHONA SAVANT A on: 10/21/2024 01:10 PM   Modules accepted: Orders

## 2024-10-21 NOTE — Patient Instructions (Addendum)
 Galcanezumab  Injection What is this medication? GALCANEZUMAB  (gal ka NEZ ue mab) prevents migraines. It works by blocking a substance in the body that causes migraines. It may also be used to treat cluster headaches. It is a monoclonal antibody. This medicine may be used for other purposes; ask your health care provider or pharmacist if you have questions. COMMON BRAND NAME(S): Emgality  What should I tell my care team before I take this medication? They need to know if you have any of these conditions: (Raynaud syndrome) HTN An unusual or allergic reaction to galcanezumab , other medications, foods, dyes, or preservatives Pregnant or trying to get pregnant Breastfeeding How should I use this medication? This medication is injected under the skin. You will be taught how to prepare and give it. Take it as directed on the prescription label. Keep taking it unless your care team tells you to stop. It is important that you put your used needles and syringes in a special sharps container. Do not put them in a trash can. If you do not have a sharps container, call your pharmacist or care team to get one. Talk to your care team about the use of this medication in children. Special care may be needed. Overdosage: If you think you have taken too much of this medicine contact a poison control center or emergency room at once. NOTE: This medicine is only for you. Do not share this medicine with others. What if I miss a dose? If you miss a dose, take it as soon as you can. If it is almost time for your next dose, take only that dose. Do not take double or extra doses. What may interact with this medication? Interactions are not expected. This list may not describe all possible interactions. Give your health care provider a list of all the medicines, herbs, non-prescription drugs, or dietary supplements you use. Also tell them if you smoke, drink alcohol, or use illegal drugs. Some items may interact with  your medicine. What should I watch for while using this medication? Visit your care team for regular checks on your progress. Tell your care team if your symptoms do not start to get better or if they get worse. What side effects may I notice from receiving this medication? Side effects that you should report to your care team as soon as possible: Allergic reactions or angioedema--skin rash, itching or hives, swelling of the face, eyes, lips, tongue, arms, or legs, trouble swallowing or breathing Increase in blood pressure Raynaud syndrome--cool, numb, or painful fingers or toes that may change color from pale, to blue, to red Side effects that usually do not require medical attention (report these to your care team if they continue or are bothersome): Pain, redness, or irritation at injection site This list may not describe all possible side effects. Call your doctor for medical advice about side effects. You may report side effects to FDA at 1-800-FDA-1088. Where should I keep my medication? Keep out of the reach of children and pets. Store in a refrigerator or at room temperature between 20 and 25 degrees C (68 and 77 degrees F). Refrigeration (preferred): Store in the refrigerator. Do not freeze. Keep in the original container until you are ready to take it. Remove the dose from the carton about 30 minutes before it is time for you to use it. If the dose is not used, it may be stored in original container at room temperature for 7 days. Get rid of any unused medication after  the expiration date. Room Temperature: This medication may be stored at room temperature for up to 7 days. Keep it in the original container. Protect from light until time of use. If it is stored at room temperature, get rid of any unused medication after 7 days or after it expires, whichever is first. To get rid of medications that are no longer needed or have expired: Take the medication to a medication take-back program.  Check with your pharmacy or law enforcement to find a location. If you cannot return the medication, ask your pharmacist or care team how to get rid of this medication safely. NOTE: This sheet is a summary. It may not cover all possible information. If you have questions about this medicine, talk to your doctor, pharmacist, or health care provider.  2025 Elsevier/Gold Standard (2024-01-08 00:00:00)   Assessment: Total time for face to face interview and examination, for review of  images and laboratory testing, neurophysiology testing and pre-existing records, including out-of -network , was 35 minutes. Assessment is as follows here:  Headaches , migraine > 12 a months , plus 10 to 15   other headaches a month ( tension)   1)  Myasthenia ocular- continue mestinon  ( [facility] ) .  On steroids for MG , continue through [facility]>   2) Migraine complex, without aura , with nausea, vertigo, and photo/ pone sensitivity.  With status migrainosus.   I suspect a vestibular migraine .  She drinks coffee - 1 large cup a day, only in AM    Plan:  Treatment plan and additional workup planned after today includes:   1)  I wanted to order Quilipta as a preventive, she cannot use higher doses of beta blocker which have not so far reduce HA frequency.   It is non formulary as is Nurtec. She is allowed to get Emgality  or ajovy , and I wrote for emgality .   I hope to reduce the headache frequency to less than 8/ months.   Keep a headache journal.  Since  Maxalt  abortive is taken very long to kick in, I am still hoping to obtain Nurtec, which  helped quickly and she may qualify for an exemption.   PLAN B)  Hydrate well, limit caffeine to once a day in AM.  If Emgality  works for you, this will become your  preventive medication.  If not will try again to obtain NURTEC and BOTOX>     Helpful Websites: www.AmericanHeadacheSociety.org patenthood.ch www.headaches.org tightmarket.nl www.achenet.org    RV with sarah slack,NP in 5-6 months with botox intention.     The patient's condition requires frequent monitoring and adjustments in the treatment plan, reflecting the ongoing complexity of care.  This provider is the continuing focal point for all needed services for this condition.

## 2024-10-21 NOTE — Progress Notes (Signed)
 Per Dr Chalice gave pt loading dose of emgality  . Place band-aids on injections sites Pt sat for 10 minutes after injection to make sure patient didn't have any reaction to medication Pt went to check out
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
## 2024-11-01 ENCOUNTER — Telehealth: Admitting: Neurology

## 2025-03-29 ENCOUNTER — Ambulatory Visit: Admitting: Neurology
